# Patient Record
Sex: Female | Born: 1965 | Race: White | Hispanic: No | Marital: Married | Smoking: Never smoker
Health system: Southern US, Community
[De-identification: ages and names within clinical notes are randomized; demographics above are authoritative.]

## PROBLEM LIST (undated history)

## (undated) DIAGNOSIS — L409 Psoriasis, unspecified: Secondary | ICD-10-CM

## (undated) DIAGNOSIS — F329 Major depressive disorder, single episode, unspecified: Secondary | ICD-10-CM

## (undated) DIAGNOSIS — F32A Depression, unspecified: Secondary | ICD-10-CM

## (undated) HISTORY — DX: Depression, unspecified: F32.A

## (undated) HISTORY — DX: Psoriasis, unspecified: L40.9

## (undated) HISTORY — DX: Major depressive disorder, single episode, unspecified: F32.9

---

## 2001-08-18 ENCOUNTER — Other Ambulatory Visit: Admission: RE | Admit: 2001-08-18 | Discharge: 2001-08-18 | Payer: Self-pay | Admitting: *Deleted

## 2001-12-07 ENCOUNTER — Inpatient Hospital Stay (HOSPITAL_COMMUNITY): Admission: AD | Admit: 2001-12-07 | Discharge: 2001-12-07 | Payer: Self-pay | Admitting: Obstetrics and Gynecology

## 2002-02-01 ENCOUNTER — Encounter: Payer: Self-pay | Admitting: Obstetrics & Gynecology

## 2002-02-01 ENCOUNTER — Inpatient Hospital Stay (HOSPITAL_COMMUNITY): Admission: AD | Admit: 2002-02-01 | Discharge: 2002-02-01 | Payer: Self-pay | Admitting: Obstetrics & Gynecology

## 2002-02-02 ENCOUNTER — Encounter: Payer: Self-pay | Admitting: Obstetrics and Gynecology

## 2002-02-02 ENCOUNTER — Inpatient Hospital Stay (HOSPITAL_COMMUNITY): Admission: AD | Admit: 2002-02-02 | Discharge: 2002-02-02 | Payer: Self-pay | Admitting: Obstetrics and Gynecology

## 2002-02-14 ENCOUNTER — Inpatient Hospital Stay (HOSPITAL_COMMUNITY): Admission: AD | Admit: 2002-02-14 | Discharge: 2002-02-14 | Payer: Self-pay | Admitting: *Deleted

## 2002-02-15 ENCOUNTER — Encounter: Payer: Self-pay | Admitting: Obstetrics and Gynecology

## 2002-02-15 ENCOUNTER — Inpatient Hospital Stay (HOSPITAL_COMMUNITY): Admission: RE | Admit: 2002-02-15 | Discharge: 2002-02-18 | Payer: Self-pay | Admitting: Obstetrics and Gynecology

## 2002-02-16 ENCOUNTER — Encounter (INDEPENDENT_AMBULATORY_CARE_PROVIDER_SITE_OTHER): Payer: Self-pay | Admitting: Specialist

## 2002-02-19 ENCOUNTER — Encounter: Admission: RE | Admit: 2002-02-19 | Discharge: 2002-03-21 | Payer: Self-pay | Admitting: Obstetrics and Gynecology

## 2002-03-22 ENCOUNTER — Encounter: Admission: RE | Admit: 2002-03-22 | Discharge: 2002-04-21 | Payer: Self-pay | Admitting: Obstetrics and Gynecology

## 2002-04-04 ENCOUNTER — Other Ambulatory Visit: Admission: RE | Admit: 2002-04-04 | Discharge: 2002-04-04 | Payer: Self-pay | Admitting: Obstetrics and Gynecology

## 2004-07-30 ENCOUNTER — Other Ambulatory Visit: Admission: RE | Admit: 2004-07-30 | Discharge: 2004-07-30 | Payer: Self-pay | Admitting: Obstetrics and Gynecology

## 2006-09-30 ENCOUNTER — Encounter: Admission: RE | Admit: 2006-09-30 | Discharge: 2006-09-30 | Payer: Self-pay | Admitting: Obstetrics and Gynecology

## 2013-03-28 ENCOUNTER — Ambulatory Visit (INDEPENDENT_AMBULATORY_CARE_PROVIDER_SITE_OTHER): Payer: BC Managed Care – PPO | Admitting: Family Medicine

## 2013-03-28 ENCOUNTER — Encounter: Payer: Self-pay | Admitting: Family Medicine

## 2013-03-28 VITALS — BP 120/70 | Temp 98.1°F | Ht 65.75 in | Wt 163.0 lb

## 2013-03-28 DIAGNOSIS — G8929 Other chronic pain: Secondary | ICD-10-CM

## 2013-03-28 DIAGNOSIS — F419 Anxiety disorder, unspecified: Principal | ICD-10-CM

## 2013-03-28 DIAGNOSIS — Z7689 Persons encountering health services in other specified circumstances: Secondary | ICD-10-CM

## 2013-03-28 DIAGNOSIS — Z7189 Other specified counseling: Secondary | ICD-10-CM

## 2013-03-28 DIAGNOSIS — F341 Dysthymic disorder: Secondary | ICD-10-CM

## 2013-03-28 DIAGNOSIS — F329 Major depressive disorder, single episode, unspecified: Secondary | ICD-10-CM

## 2013-03-28 DIAGNOSIS — M549 Dorsalgia, unspecified: Secondary | ICD-10-CM

## 2013-03-28 DIAGNOSIS — M542 Cervicalgia: Secondary | ICD-10-CM

## 2013-03-28 MED ORDER — CYCLOBENZAPRINE HCL 5 MG PO TABS: 5.0000 mg | ORAL_TABLET | Freq: Three times a day (TID) | ORAL | Status: DC | PRN

## 2013-03-28 MED ORDER — ESCITALOPRAM OXALATE 5 MG PO TABS: 5.0000 mg | ORAL_TABLET | Freq: Every day | ORAL | Status: DC

## 2013-03-28 NOTE — Progress Notes (Signed)
Chief Complaint  Patient presents with  . Establish Care  . neck and back pain    HPI:  Jamie Smith is here to establish care.  Last PCP and physical: Dr. Rana Snare - UTD on yearly exam and labs, labs in last year all good  Has the following chronic problems and concerns today:  There are no active problems to display for this patient.  Neck and Back Pain: -carries stress in shoulders and back and has chronic issues with this on and off -3 days ago pulled a muscle lifting futon mattress up a flight of staires -Pain: between shoulder plades on L and L shouder and neck - now improving -heat helped, advil helped -denies: fevers, malaise, numbness or weakness in the UEs  Depression: -related to stress at work -poor sleep, eating the wrong things, stress, irritability -denies hx of hurting herself, has been on prozac in the past  Psoriasis: -sees dermatologist  Health Maintenance: -UTD on flu vaccine -does not want tdap  ROS: See pertinent positives and negatives per HPI.  Past Medical History  Diagnosis Date  . Depression   . Psoriasis     Family History  Problem Relation Age of Onset  . Cancer Father     colon, non hodkin lymphoma, liver  . Breast cancer    . Hyperlipidemia    . Heart disease    . Stroke    . Hypertension    . Diabetes      History   Social History  . Marital Status: Married    Spouse Name: N/A    Number of Children: N/A  . Years of Education: N/A   Social History Main Topics  . Smoking status: Never Smoker   . Smokeless tobacco: None  . Alcohol Use: Yes     Comment: a couple drinks a week   . Drug Use: None  . Sexual Activity: None   Other Topics Concern  . None   Social History Narrative   Work or School: Secondary school teacher Situation: lives with husband and children      Spiritual Beliefs: Christian      Lifestyle: no regular exercise; diet ok             Current outpatient prescriptions:norgestrel-ethinyl  estradiol (LO/OVRAL,CRYSELLE) 0.3-30 MG-MCG tablet, Take 1 tablet by mouth daily., Disp: , Rfl: ;  clobetasol (OLUX) 0.05 % topical foam, Apply topically 2 (two) times daily. , Disp: , Rfl: ;  cyclobenzaprine (FLEXERIL) 5 MG tablet, Take 1 tablet (5 mg total) by mouth 3 (three) times daily as needed for muscle spasms., Disp: 20 tablet, Rfl: 0 escitalopram (LEXAPRO) 5 MG tablet, Take 1 tablet (5 mg total) by mouth daily., Disp: 30 tablet, Rfl: 0  EXAM:  Filed Vitals:   03/28/13 1112  BP: 120/70  Temp: 98.1 F (36.7 C)    Body mass index is 26.51 kg/(m^2).  GENERAL: vitals reviewed and listed above, alert, oriented, appears well hydrated and in no acute distress  HEENT: atraumatic, conjunttiva clear, no obvious abnormalities on inspection of external nose and ears  NECK: no obvious masses on inspection  LUNGS: clear to auscultation bilaterally, no wheezes, rales or rhonchi, good air movement  CV: HRRR, no peripheral edema  MS/NEURO: moves all extremities without noticeable abnormality TTP L rhomboids and traps, neg spurling, no bony TTP, normal rom neck and normal strength in UE bilat  PSYCH: pleasant and cooperative, depressed mood  ASSESSMENT AND PLAN:  Discussed  the following assessment and plan:  Anxiety and depression - Plan: escitalopram (LEXAPRO) 5 MG tablet -discussed options and decided on trial of lexapro after discussion risks and benefits -advised counseling -follow up in 1 month  Encounter to establish care  neck and back pain -muscle strain likely -discussed tx options and opted for low dose muscle relaxer for a few days and tx per insturctions -HEP provided -follow up in 1 month  -We reviewed the PMH, PSH, FH, SH, Meds and Allergies. -We provided refills for any medications we will prescribe as needed. -We addressed current concerns per orders and patient instructions. -We have asked for records for pertinent exams, studies, vaccines and notes from  previous providers. -We have advised patient to follow up per instructions below.   -Patient advised to return or notify a doctor immediately if symptoms worsen or persist or new concerns arise.  Patient Instructions   -PLEASE SIGN UP FOR MYCHART TODAY   We recommend the following healthy lifestyle measures: - eat a healthy diet consisting of lots of vegetables, fruits, beans, nuts, seeds, healthy meats such as white chicken and fish and whole grains.  - avoid fried foods, fast food, processed foods, sodas, red meet and other fattening foods.  - get a least 150 minutes of aerobic exercise per week.   Heat, topical sports creams and exercises for neck and back  -As we discussed, we have prescribed a new medication for you at this appointment. We discussed the common and serious potential adverse effects of this medication and you can review these and more with the pharmacist when you pick up your medication.  Please follow the instructions for use carefully and notify us immediately if you have any problems taking this medication.  Consider counseling  Follow up in: 1 month      Landis Dowdy R.

## 2013-03-28 NOTE — Patient Instructions (Signed)
-  PLEASE SIGN UP FOR MYCHART TODAY   We recommend the following healthy lifestyle measures: - eat a healthy diet consisting of lots of vegetables, fruits, beans, nuts, seeds, healthy meats such as white chicken and fish and whole grains.  - avoid fried foods, fast food, processed foods, sodas, red meet and other fattening foods.  - get a least 150 minutes of aerobic exercise per week.   Heat, topical sports creams and exercises for neck and back  -As we discussed, we have prescribed a new medication for you at this appointment. We discussed the common and serious potential adverse effects of this medication and you can review these and more with the pharmacist when you pick up your medication.  Please follow the instructions for use carefully and notify us immediately if you have any problems taking this medication.  Consider counseling  Follow up in: 1 month

## 2013-03-28 NOTE — Progress Notes (Signed)
Pre visit review using our clinic review tool, if applicable. No additional management support is needed unless otherwise documented below in the visit note. 

## 2013-12-01 ENCOUNTER — Ambulatory Visit (INDEPENDENT_AMBULATORY_CARE_PROVIDER_SITE_OTHER): Payer: BC Managed Care – PPO | Admitting: Family Medicine

## 2013-12-01 ENCOUNTER — Encounter: Payer: Self-pay | Admitting: Family Medicine

## 2013-12-01 VITALS — BP 110/80 | HR 61 | Temp 98.4°F | Ht 65.75 in

## 2013-12-01 DIAGNOSIS — W57XXXA Bitten or stung by nonvenomous insect and other nonvenomous arthropods, initial encounter: Secondary | ICD-10-CM

## 2013-12-01 DIAGNOSIS — T148 Other injury of unspecified body region: Secondary | ICD-10-CM

## 2013-12-01 MED ORDER — TRIAMCINOLONE ACETONIDE 0.1 % EX CREA: 1.0000 "application " | TOPICAL_CREAM | Freq: Two times a day (BID) | CUTANEOUS | Status: DC

## 2013-12-01 NOTE — Progress Notes (Signed)
No chief complaint on file.   HPI:  Acute visit for:  1) Rash: -Note: she has psoriasis and reported at npv (last visit with me) was seeing dermatologist -started 1 month ago after staying in a vacation rental that she was not happy about  - all over, right now under breast andon back - occ a few bumps comes up then goes away -very itchy - benadryl spray makes it burn -on clobetasol for her psoriasis but has not tried this for these other areas -denies: fevers, malaise, sob -no new exposures that she can think of  ROS: See pertinent positives and negatives per HPI.  Past Medical History  Diagnosis Date  . Depression   . Psoriasis     No past surgical history on file.  Family History  Problem Relation Age of Onset  . Cancer Father     colon, non hodkin lymphoma, liver  . Breast cancer    . Hyperlipidemia    . Heart disease    . Stroke    . Hypertension    . Diabetes      History   Social History  . Marital Status: Married    Spouse Name: N/A    Number of Children: N/A  . Years of Education: N/A   Social History Main Topics  . Smoking status: Never Smoker   . Smokeless tobacco: None  . Alcohol Use: Yes     Comment: a couple drinks a week   . Drug Use: None  . Sexual Activity: None   Other Topics Concern  . 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 , Disp: , Rfl: ;  norgestrel-ethinyl estradiol (LO/OVRAL,CRYSELLE) 0.3-30 MG-MCG tablet, Take 1 tablet by mouth daily., Disp: , Rfl: ;  triamcinolone cream (KENALOG) 0.1 %, Apply 1 application topically 2 (two) times daily., Disp: 30 g, Rfl: 0  EXAM:  Filed Vitals:   12/01/13 1358  BP: 110/80  Pulse: 61  Temp: 98.4 F (36.9 C)     Body mass index is 0.00 kg/(m^2).  GENERAL: vitals reviewed and listed above, alert, oriented, appears well hydrated and in no acute distress  HEENT: atraumatic, conjunttiva clear, no obvious abnormalities on inspection of external nose and ears  NECK: no obvious masses on inspection  SKIN: cluster of erythematous papules on L abd and a few scattered papules on back  MS: moves all extremities without noticeable abnormality  PSYCH: pleasant and cooperative, no obvious depression or anxiety  ASSESSMENT AND PLAN:  Discussed the following assessment and plan:  Insect bites - Plan: triamcinolone cream (KENALOG) 0.1 %  -likely bed bug, other etiolgies discussed -number provided for exterminator, advised to follow up with derm if persists -Patient advised to return or notify a doctor immediately if symptoms worsen or persist or new concerns arise.  Patient Instructions  Insect Bite Mosquitoes, flies, fleas, bedbugs, and many other insects can bite. Insect bites are different from insect stings. A sting is when venom is injected into the skin. Some insect bites can transmit infectious diseases. SYMPTOMS  Insect bites usually turn red, swell, and itch for 2 to 4 days. They often go  away on their own. TREATMENT  Your caregiver may prescribe antibiotic medicines if a bacterial infection develops in the bite. HOME CARE INSTRUCTIONS  Do not scratch the bite area.  Keep the bite area clean and dry. Wash the bite area thoroughly with soap and water.  Put ice or cool compresses on the bite area.  Put ice in a plastic bag.  Place a towel between your skin and the bag.  Leave the ice on for 20 minutes, 4 times a day for the first 2 to 3 days, or as directed.  You may apply a baking soda paste, cortisone cream, or calamine lotion to the bite area as directed by your caregiver. This can help reduce itching and swelling.  Only take over-the-counter or prescription medicines as  directed by your caregiver.  If you are given antibiotics, take them as directed. Finish them even if you start to feel better. You may need a tetanus shot if:  You cannot remember when you had your last tetanus shot.  You have never had a tetanus shot.  The injury broke your skin. If you get a tetanus shot, your arm may swell, get red, and feel warm to the touch. This is common and not a problem. If you need a tetanus shot and you choose not to have one, there is a rare chance of getting tetanus. Sickness from tetanus can be serious. SEEK IMMEDIATE MEDICAL CARE IF:   You have increased pain, redness, or swelling in the bite area.  You see a red line on the skin coming from the bite.  You have a fever.  You have joint pain.  You have a headache or neck pain.  You have unusual weakness.  You have a rash.  You have chest pain or shortness of breath.  You have abdominal pain, nausea, or vomiting.  You feel unusually tired or sleepy. MAKE SURE YOU:   Understand these instructions.  Will watch your condition.  Will get help right away if you are not doing well or get worse. Document Released: 03/26/2004 Document Revised: 05/11/2011 Document Reviewed: 09/17/2010 [facility] Patient Information 2015 [employer], [place]. This information is not intended to replace advice given to you by your health care provider. Make sure you discuss any questions you have with your health care provider.      Kriste BasqueKIM, HANNAH R.

## 2013-12-01 NOTE — Patient Instructions (Signed)

## 2013-12-01 NOTE — Progress Notes (Signed)
Pre visit review using our clinic review tool, if applicable. No additional management support is needed unless otherwise documented below in the visit note. 

## 2014-04-23 ENCOUNTER — Other Ambulatory Visit: Payer: Self-pay | Admitting: Obstetrics and Gynecology

## 2014-04-25 LAB — CYTOLOGY - PAP

## 2014-06-14 ENCOUNTER — Encounter: Payer: Self-pay | Admitting: Nurse Practitioner

## 2014-06-14 ENCOUNTER — Ambulatory Visit (INDEPENDENT_AMBULATORY_CARE_PROVIDER_SITE_OTHER): Payer: BLUE CROSS/BLUE SHIELD | Admitting: Nurse Practitioner

## 2014-06-14 VITALS — BP 130/83 | HR 63 | Temp 98.9°F | Ht 66.0 in | Wt 159.0 lb

## 2014-06-14 DIAGNOSIS — Z23 Encounter for immunization: Secondary | ICD-10-CM

## 2014-06-14 DIAGNOSIS — R5383 Other fatigue: Secondary | ICD-10-CM | POA: Diagnosis not present

## 2014-06-14 DIAGNOSIS — M47816 Spondylosis without myelopathy or radiculopathy, lumbar region: Secondary | ICD-10-CM | POA: Insufficient documentation

## 2014-06-14 DIAGNOSIS — K5900 Constipation, unspecified: Secondary | ICD-10-CM | POA: Insufficient documentation

## 2014-06-14 DIAGNOSIS — M79604 Pain in right leg: Secondary | ICD-10-CM

## 2014-06-14 DIAGNOSIS — M79605 Pain in left leg: Secondary | ICD-10-CM

## 2014-06-14 DIAGNOSIS — L409 Psoriasis, unspecified: Secondary | ICD-10-CM

## 2014-06-14 NOTE — Progress Notes (Signed)
Subjective:     Jamie Smith is a 49 y.o. female who presents for evaluation of fatigue and bilateral leg pain. Fatigue began "years" ago, leg pain started about 2 mos ago. Symptoms of her fatigue have been general malaise. Patient describes the following psychological symptoms: none and she was seen few mos ago by PCP for "depression & weight gain" Pt states this was situational.  Due to a new job. She has since quit the job. She states she does not feel depressed, just extremely tired. She is not exercising due to fatigue. She goes to work, goes to [employer], dispensing sports games. Hx includes psoriasis in scalp & nails. She was seen for rash several mos ago, derm called it "some kind of dermatitis", resolved with steroid cream, but "it came back". Pt describes rash as "tiny red bumps on chest & trunk" She c/o pruritis in trunk. No rash today.  Legs ache as if she "ran a marathon" Pain starts in morning & persists throughout day. R hip aches. She saw Dr bean 2 yrs ago, he treated her for trocanteric bursitis with steroid inj into bursa. Pt had pain relief for few months, then pain returned. Denies fevers, joint pain & swelling, diarrhea. She reports occasional night sweat for several months. Fam Hx significant for cancer: father colon, PGM stomach. She hAD colonoscopy 4 yrs ago, no polyps.  The following portions of the patient's history were reviewed and updated as appropriate: allergies, current medications, past family history, past medical history, past social history, past surgical history and problem list.  Review of Systems Constitutional: negative for weight loss Eyes: positive for uses readers Ears, nose, mouth, throat, and face: positive for occasional "sore in mouth", negative for nasal congestion Respiratory: negative for cough Cardiovascular: positive for ocasional palpitation lasting few seconds Gastrointestinal: positive for constipation and sometimes goes 1 week without BM Genitourinary:not  having MC-started OBCP 2 yrs ago for heavy bleeding & "mood control" Allergic/Immunologic: negative for hay fever    Objective:    BP 130/83 mmHg  Pulse 63  Temp(Src) 98.9 F (37.2 C) (Oral)  Ht 5' 6"  (1.676 m)  Wt 159 lb (72.122 kg)  BMI 25.68 kg/m2  SpO2 100% General appearance: alert, cooperative, appears stated age, no distress and varied & appropriate affect Head: Normocephalic, without obvious abnormality, atraumatic Eyes: negative findings: lids and lashes normal, conjunctivae and sclerae normal, corneas clear and pupils equal, round, reactive to light and accomodation Ears: normal TM's and external ear canals both ears Throat: lips, mucosa, and tongue normal; teeth and gums normal Neck: no adenopathy, no carotid bruit, supple, symmetrical, trachea midline and thyroid not enlarged, symmetric, no tenderness/mass/nodules Lungs: clear to auscultation bilaterally Heart: regular rate and rhythm, S1, S2 normal, no murmur, click, rub or gallop Abdomen: soft, non-tender; bowel sounds normal; no masses,  no organomegaly Skin: scaly raised patch L scalp. psoriatic nails of bilat hands & R great toenail Lymph nodes: Cervical adenopathy: none and Supraclavicular adenopathy: none Neurologic: Grossly normal  Extremities: tender R MTP on squeeze test Dip & pip not tender in hands or feet. No swelling noted.  Assessment:Plan  1. Other fatigue DD: AI disease, cancer, FM, depression, thyroid disease, anemia - ANA w/Reflex if Positive - Angiotensin converting enzyme - CK - CBC with Differential/Platelet - Comprehensive metabolic panel - T4, free - TSH - Serum protein electrophoresis with reflex - HIV antibody - Vitamin B12 - Vit D  25 hydroxy (rtn osteoporosis monitoring) - Urine Microscopic - HLA-B27 antigen -  Sed Rate (ESR)  2. Psoriasis - ANA w/Reflex if Positive - Angiotensin converting enzyme - CK - CBC with Differential/Platelet - Comprehensive metabolic panel - T4,  free - TSH - Serum protein electrophoresis with reflex - HIV antibody - Vitamin B12 - Vit D  25 hydroxy (rtn osteoporosis monitoring) - Urine Microscopic - HLA-B27 antigen - Sed Rate (ESR)  3. Need for Tdap vaccination - Tdap vaccine greater than or equal to 7yo IM  4. Constipation, unspecified constipation type See pt instructions Start probiotic take for 3  Mos Use mirilax to have BM q3d  5. Leg pain, bilateral DD: polymyositis, FM, dermatomyositis Nsaid trial Start curmarin   F/u 2 weeks-review labs, trial NSAIDS & curmarin for leg pain,

## 2014-06-14 NOTE — Progress Notes (Signed)
Pre visit review using our clinic review tool, if applicable. No additional management support is needed unless otherwise documented below in the visit note. 

## 2014-06-14 NOTE — Patient Instructions (Signed)
Please start probiotic. Take daily for 3 months. I hope this will help with constipation. A healthy colony of bacteria also helps with immune function. Get Align or generic with same type bacteria. Do not go longer than 3 days without bowel movement. Take mirilax every 3 days if needed as follows: 1 capful in 8 oz water every hour until BM up to 5 doses in 24 hours. If no BM after 5 doses, start again next day.  Please start curmarin 2000 mg daily for leg pain. This is natural anti-inflammatory.  Please take 600 mg ibuprophen at least once to see if it relieves or decreases leg pain. This helps me figure out if pain is responsive to anti-inflammatories.  I will discuss lab results at return appointment.  Very nice to meet you!

## 2014-06-15 LAB — COMPREHENSIVE METABOLIC PANEL
ALT: 13 U/L (ref 0–35)
AST: 15 U/L (ref 0–37)
Albumin: 4.1 g/dL (ref 3.5–5.2)
Alkaline Phosphatase: 23 U/L — ABNORMAL LOW (ref 39–117)
BILIRUBIN TOTAL: 0.3 mg/dL (ref 0.2–1.2)
BUN: 13 mg/dL (ref 6–23)
CO2: 28 mEq/L (ref 19–32)
Calcium: 9.4 mg/dL (ref 8.4–10.5)
Chloride: 103 mEq/L (ref 96–112)
Creatinine, Ser: 0.91 mg/dL (ref 0.40–1.20)
GFR: 70.51 mL/min (ref >60.00)
Glucose, Bld: 111 mg/dL — ABNORMAL HIGH (ref 70–99)
Potassium: 4 mEq/L (ref 3.5–5.1)
SODIUM: 136 meq/L (ref 135–145)
Total Protein: 7.3 g/dL (ref 6.0–8.3)

## 2014-06-15 LAB — CBC WITH DIFFERENTIAL/PLATELET
BASOS ABS: 0 10*3/uL (ref 0.0–0.1)
Basophils Relative: 0.2 % (ref 0.0–3.0)
Eosinophils Absolute: 0.1 10*3/uL (ref 0.0–0.7)
Eosinophils Relative: 1.1 % (ref 0.0–5.0)
HEMATOCRIT: 40.1 % (ref 36.0–46.0)
Hemoglobin: 13.4 g/dL (ref 12.0–15.0)
Lymphocytes Relative: 27 % (ref 12.0–46.0)
Lymphs Abs: 1.5 10*3/uL (ref 0.7–4.0)
MCHC: 33.3 g/dL (ref 30.0–36.0)
MCV: 88.3 fl (ref 78.0–100.0)
MONOS PCT: 6.9 % (ref 3.0–12.0)
Monocytes Absolute: 0.4 10*3/uL (ref 0.1–1.0)
NEUTROS PCT: 64.8 % (ref 43.0–77.0)
Neutro Abs: 3.7 10*3/uL (ref 1.4–7.7)
PLATELETS: 280 10*3/uL (ref 150.0–400.0)
RBC: 4.54 Mil/uL (ref 3.87–5.11)
RDW: 13 % (ref 11.5–15.5)
WBC: 5.7 10*3/uL (ref 4.0–10.5)

## 2014-06-15 LAB — URINALYSIS, MICROSCOPIC ONLY

## 2014-06-15 LAB — VITAMIN B12: Vitamin B-12: 993 pg/mL — ABNORMAL HIGH (ref 211–911)

## 2014-06-15 LAB — T4, FREE: Free T4: 0.7 ng/dL (ref 0.60–1.60)

## 2014-06-15 LAB — HIV ANTIBODY (ROUTINE TESTING W REFLEX): HIV: NONREACTIVE

## 2014-06-15 LAB — VITAMIN D 25 HYDROXY (VIT D DEFICIENCY, FRACTURES): VITD: 49.17 ng/mL (ref 30.00–100.00)

## 2014-06-15 LAB — TSH: TSH: 1.38 u[IU]/mL (ref 0.35–4.50)

## 2014-06-15 LAB — SEDIMENTATION RATE: Sed Rate: 4 mm/hr (ref 0–20)

## 2014-06-15 LAB — ANGIOTENSIN CONVERTING ENZYME: Angiotensin-Converting Enzyme: 16 U/L (ref 8–52)

## 2014-06-15 LAB — ANA W/REFLEX IF POSITIVE: Anti Nuclear Antibody(ANA): NEGATIVE

## 2014-06-15 LAB — CK: Total CK: 55 U/L (ref 7–177)

## 2014-06-18 LAB — PROTEIN ELECTROPHORESIS, SERUM, WITH REFLEX
Albumin ELP: 4.1 g/dL (ref 3.8–4.8)
Alpha-1-Globulin: 0.4 g/dL — ABNORMAL HIGH (ref 0.2–0.3)
Alpha-2-Globulin: 0.7 g/dL (ref 0.5–0.9)
BETA 2: 0.4 g/dL (ref 0.2–0.5)
Beta Globulin: 0.5 g/dL (ref 0.4–0.6)
Gamma Globulin: 1.2 g/dL (ref 0.8–1.7)
Total Protein, Serum Electrophoresis: 7.2 g/dL (ref 6.1–8.1)

## 2014-06-19 ENCOUNTER — Telehealth: Payer: Self-pay | Admitting: Nurse Practitioner

## 2014-06-19 ENCOUNTER — Telehealth: Payer: Self-pay | Admitting: Family Medicine

## 2014-06-19 LAB — HLA-B27 ANTIGEN: DNA RESULT: NOT DETECTED

## 2014-06-19 NOTE — Telephone Encounter (Signed)
Patient is calling to see if lab results are avail

## 2014-06-19 NOTE — Telephone Encounter (Signed)
Awaiting more labs to be resulted. Called the lab and should be resulted within 4-5 days. It was done on the 14th but late in the afternoon so results should be avail tomorrow.

## 2014-06-19 NOTE — Telephone Encounter (Signed)
LMOVM informing patient of normal labs and that we were awaiting one more lab result. VM was identified. Told patient to CB with any questions or concerns

## 2014-06-19 NOTE — Telephone Encounter (Signed)
You may call pt & tell her we are waiting on 1 more lab, but all labs are nml.

## 2014-06-19 NOTE — Telephone Encounter (Signed)
Please advise 

## 2014-06-20 ENCOUNTER — Telehealth: Payer: Self-pay | Admitting: Nurse Practitioner

## 2014-06-20 DIAGNOSIS — R778 Other specified abnormalities of plasma proteins: Secondary | ICD-10-CM

## 2014-06-20 NOTE — Telephone Encounter (Signed)
pls call pt: Advise There is one nonspecific inflammatory marker that is slightly high. I want to repeat it along with some other labs.   Continue taking curmarin & consider adding CoQ10 100 mg daily (10 amino acids that help with muscle function). It may help with leg pain. Pls Sched OV 2 wks from last appt. Future labs placed.

## 2014-06-20 NOTE — Telephone Encounter (Signed)
Called and informed patient of results. Lab appt scheduled along with OV

## 2014-06-22 ENCOUNTER — Other Ambulatory Visit: Payer: Self-pay | Admitting: Nurse Practitioner

## 2014-06-22 ENCOUNTER — Encounter: Payer: Self-pay | Admitting: Nurse Practitioner

## 2014-06-22 ENCOUNTER — Telehealth: Payer: Self-pay | Admitting: Nurse Practitioner

## 2014-06-22 DIAGNOSIS — R778 Other specified abnormalities of plasma proteins: Secondary | ICD-10-CM

## 2014-06-22 DIAGNOSIS — R0602 Shortness of breath: Secondary | ICD-10-CM

## 2014-06-22 DIAGNOSIS — R6889 Other general symptoms and signs: Secondary | ICD-10-CM

## 2014-06-22 NOTE — Progress Notes (Signed)
Add TPO, repeat TSH & T4 as pt c/o cold intolerance , fam Hx thyroid diseASE, T4 low nml.

## 2014-06-22 NOTE — Progress Notes (Signed)
Alpha 1 elevated DD: autoimmune disease (sle, RA, hashimoto thyroid); cancer; acute inflammatory disease  Screening tests: ANA, CK, ACE, ESR, CBC, CMET nml. Alk phos slightly low, vit D 49  Will add quantittative immunoglobulins, RF, aCCP, CXR, UPEP, smear  

## 2014-06-22 NOTE — Progress Notes (Signed)
LMOVM, identified informing patient to get cxr before [facility]. Told patient to CB with any questions or concerns

## 2014-06-22 NOTE — Progress Notes (Signed)
Done

## 2014-06-22 NOTE — Telephone Encounter (Signed)
AddedTPO & will repeat TSH T4.  She reports cold intol, mother had thyroid disease.   Pt states she is not SOB w/exertion. Will include CXR as part of w/u for gen fatigue, leg pain, elevated Alpha 1. Explianed all to pt. Answered all questions.

## 2014-06-28 ENCOUNTER — Ambulatory Visit (HOSPITAL_BASED_OUTPATIENT_CLINIC_OR_DEPARTMENT_OTHER)
Admission: RE | Admit: 2014-06-28 | Discharge: 2014-06-28 | Disposition: A | Discharge status: Home / Self Care | Payer: BLUE CROSS/BLUE SHIELD | Source: Ambulatory Visit | Attending: Nurse Practitioner | Admitting: Nurse Practitioner

## 2014-06-28 ENCOUNTER — Other Ambulatory Visit (INDEPENDENT_AMBULATORY_CARE_PROVIDER_SITE_OTHER): Payer: BLUE CROSS/BLUE SHIELD

## 2014-06-28 ENCOUNTER — Other Ambulatory Visit: Payer: Self-pay | Admitting: Nurse Practitioner

## 2014-06-28 ENCOUNTER — Telehealth: Payer: Self-pay | Admitting: Nurse Practitioner

## 2014-06-28 DIAGNOSIS — R0602 Shortness of breath: Secondary | ICD-10-CM | POA: Diagnosis present

## 2014-06-28 DIAGNOSIS — R778 Other specified abnormalities of plasma proteins: Secondary | ICD-10-CM

## 2014-06-28 DIAGNOSIS — R769 Abnormal immunological finding in serum, unspecified: Secondary | ICD-10-CM

## 2014-06-28 DIAGNOSIS — R6889 Other general symptoms and signs: Secondary | ICD-10-CM

## 2014-06-28 LAB — RHEUMATOID FACTOR: Rhuematoid fact SerPl-aCnc: <10 IU/mL (ref <=14)

## 2014-06-28 LAB — T4, FREE: Free T4: 1.04 ng/dL (ref 0.80–1.80)

## 2014-06-28 LAB — TSH: TSH: 0.751 u[IU]/mL (ref 0.350–4.500)

## 2014-06-28 NOTE — Telephone Encounter (Signed)
pls call pt: Advise Great news: CXR is clear!

## 2014-06-28 NOTE — Telephone Encounter (Signed)
Patient is coming in for lab draw at 4p today (06/28/14) will inform her of results of CXR then.

## 2014-06-29 LAB — IMMUNOGLOBULINS A/E/G/M, SERUM
IGM (IMMUNOGLOBULIN M), SRM: 69 mg/dL (ref 26–217)
IgA/Immunoglobulin A, Serum: 170 mg/dL (ref 87–352)
IgE (Immunoglobulin E), Serum: 39 IU/mL (ref 0–100)
IgG (Immunoglobin G), Serum: 1248 mg/dL (ref 700–1600)

## 2014-06-29 LAB — PATHOLOGIST SMEAR REVIEW

## 2014-06-29 LAB — MICROALBUMIN / CREATININE URINE RATIO
Creatinine, Urine: 81.7 mg/dL
MICROALB/CREAT RATIO: 3.7 mg/g (ref 0.0–30.0)
Microalb, Ur: 0.3 mg/dL (ref <2.0)

## 2014-06-29 LAB — THYROID PEROXIDASE ANTIBODY: Thyroperoxidase Ab SerPl-aCnc: <1 IU/mL (ref <9)

## 2014-07-02 LAB — CYCLIC CITRUL PEPTIDE ANTIBODY, IGG

## 2014-07-02 NOTE — Addendum Note (Signed)
Addended by: Eulah PontALBRIGHT, Ishmel Acevedo M on: 07/02/2014 09:37 AM   Modules accepted: Orders

## 2014-07-04 LAB — PROTEIN ELECTROPHORESIS, URINE REFLEX
Total Protein, Urine/Day: 76 mg/d (ref 50–100)
Total Protein, Urine: 4 mg/dL

## 2014-07-05 ENCOUNTER — Encounter: Payer: Self-pay | Admitting: Nurse Practitioner

## 2014-07-05 ENCOUNTER — Ambulatory Visit (INDEPENDENT_AMBULATORY_CARE_PROVIDER_SITE_OTHER): Payer: BLUE CROSS/BLUE SHIELD | Admitting: Nurse Practitioner

## 2014-07-05 VITALS — BP 130/81 | HR 68 | Temp 98.0°F | Ht 66.0 in | Wt 159.0 lb

## 2014-07-05 DIAGNOSIS — R5383 Other fatigue: Secondary | ICD-10-CM

## 2014-07-05 DIAGNOSIS — M79604 Pain in right leg: Secondary | ICD-10-CM | POA: Diagnosis not present

## 2014-07-05 DIAGNOSIS — G8929 Other chronic pain: Secondary | ICD-10-CM

## 2014-07-05 DIAGNOSIS — M533 Sacrococcygeal disorders, not elsewhere classified: Secondary | ICD-10-CM

## 2014-07-05 DIAGNOSIS — M79605 Pain in left leg: Secondary | ICD-10-CM

## 2014-07-05 MED ORDER — DULOXETINE HCL 30 MG PO CPEP: 30.0000 mg | ORAL_CAPSULE | Freq: Every day | ORAL | Status: DC

## 2014-07-05 NOTE — Progress Notes (Signed)
Pre visit review using our clinic review tool, if applicable. No additional management support is needed unless otherwise documented below in the visit note. 

## 2014-07-05 NOTE — Progress Notes (Signed)
Subjective:     Jamie Smith is a 49 y.o. female who presents for f/u of constellation of symptoms including fatigue, bilateral leg pain and leg cramps, constipation, cold intolerance. Her symptoms started 5 mos ago w/cramping in feet which progressed to bilat aching in calves & thighs accompanied by fatigue, constipation. Her w/u has included CXR, CBC, CMET, ANA, CK, ACE, SPEP, UPEP, RF, aCCP, Quant immunoglobulins, TSH, free T4, TPO ab, path smear, HIV, ESR, HLA B27. No abnml findings.She has known psoriasis involving scalp & nails. Denies fever, SOB, palpitations, chest pain, nausea, weight & appetite changes. I started her on CoQ10, she has not had improvement in leg pain. Pain is intermittent-most days, sometimes mild, other days moderate to severe. Ibuprophen does not help. Fatigue is overwhelming-doesn't want to exercise, "deal with kids", etc, but still working, cooks everyday for family, goes to kids sporting events.  She saw gynecologist 3 mos ago-had nml pap smear. Was started on prescription vit d. She takes OBCP due to Hx menorrhagia. Considering having abltaion & stopping OBCP.  BMI is 25, eats healthy diet-cooks at home, fresh fruits & veggies, low sugar. Fam Hx is pos for lymphoma. 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 She has had 2 jobs since then-1st job was extremely stressful-she gained 15 lbs. & quit after 3 mos. She is busy w/current job, works 40-60 hrs week. Does not feel particularly stressed, but expresses she wishes she was still at home, although financially it isn't option. Her father died 3 yrs ago, was close to him, feels handled death well. Still has 2 kids at home, 1 in college.  The following portions of the patient's history were reviewed and updated as appropriate: allergies, current medications, past family history, past medical history, past social history, past surgical history and problem list.  Review of Systems Pertinent  items are noted in HPI.    Objective:    BP 130/81 mmHg  Pulse 68  Temp(Src) 98 F (36.7 C) (Oral)  Ht 5' 6"  (1.676 m)  Wt 159 lb (72.122 kg)  BMI 25.68 kg/m2  SpO2 100% General appearance: alert, cooperative, appears stated age, fatigued and flat affect Head: Normocephalic, without obvious abnormality, atraumatic Eyes: negative findings: lids and lashes normal and conjunctivae and sclerae normal Back: no skin lesions, erythema, or scars, range of motion normal, tender at R SI joint Lungs: clear to auscultation bilaterally Heart: regular rate and rhythm, S1, S2 normal, no murmur, click, rub or gallop Abdomen: soft, non-tender; bowel sounds normal; no masses,  no organomegaly Extremities: extremities normal, atraumatic, no cyanosis or edema and tender over R trocanteric bursa Pulses: 2+ and symmetric femoral & radial synchronous Lymph nodes: Cervical, supraclavicular, and axillary nodes normal. Neurologic: Mental status: Alert, oriented, thought content appropriate Motor: grade 5 LE Reflexes: patellar reflexes +2 Gait: Normal    Assessment:Plan     1. Chronic right SI joint pain DD: possible spondyloarthropathy - DG Pelvis 1-2 Views; Future - DG Lumbar Spine 2-3 Views; Future - DULoxetine (CYMBALTA) 30 MG capsule; Take 1 capsule (30 mg total) by mouth daily.  Dispense: 30 capsule; Refill: 1  2. Bilateral leg pain DD: radicular pain back, ovarian ca, vascular, viral syndrome, mitochondrial disorder start Mg 250 mg qd Continue CoQ10 - DG Pelvis 1-2 Views; Future - DG Lumbar Spine 2-3 Views; Future  3. Other fatigue DD: depression, cancer, lack of exercise, viral syndrome  F/u 3 weeks, consider abd & pelvis  CT if xrays nml & no response to Mg & cymbalta

## 2014-07-05 NOTE — Patient Instructions (Addendum)
Start Magnesium 250 mg at bedtime. If it causes diarrhea, cut back to 125 mg. Continue coQ10 for another 3-4 weeks.   Consider starting cymbalta for leg pain.  Please get xrays. I will call with results.

## 2014-07-24 ENCOUNTER — Telehealth: Payer: Self-pay | Admitting: Nurse Practitioner

## 2014-07-24 ENCOUNTER — Ambulatory Visit (HOSPITAL_BASED_OUTPATIENT_CLINIC_OR_DEPARTMENT_OTHER)
Admission: RE | Admit: 2014-07-24 | Discharge: 2014-07-24 | Disposition: A | Discharge status: Home / Self Care | Payer: BLUE CROSS/BLUE SHIELD | Source: Ambulatory Visit | Attending: Nurse Practitioner | Admitting: Nurse Practitioner

## 2014-07-24 DIAGNOSIS — M25551 Pain in right hip: Secondary | ICD-10-CM | POA: Insufficient documentation

## 2014-07-24 DIAGNOSIS — M47896 Other spondylosis, lumbar region: Secondary | ICD-10-CM | POA: Insufficient documentation

## 2014-07-24 DIAGNOSIS — M79605 Pain in left leg: Secondary | ICD-10-CM

## 2014-07-24 DIAGNOSIS — M79604 Pain in right leg: Secondary | ICD-10-CM

## 2014-07-24 DIAGNOSIS — G8929 Other chronic pain: Secondary | ICD-10-CM

## 2014-07-24 DIAGNOSIS — M1610 Unilateral primary osteoarthritis, unspecified hip: Secondary | ICD-10-CM

## 2014-07-24 DIAGNOSIS — M545 Low back pain: Secondary | ICD-10-CM | POA: Diagnosis present

## 2014-07-24 DIAGNOSIS — M533 Sacrococcygeal disorders, not elsewhere classified: Secondary | ICD-10-CM

## 2014-07-24 DIAGNOSIS — M47816 Spondylosis without myelopathy or radiculopathy, lumbar region: Secondary | ICD-10-CM

## 2014-07-24 MED ORDER — CELECOXIB 100 MG PO CAPS: ORAL_CAPSULE | ORAL | Status: DC

## 2014-07-24 NOTE — Telephone Encounter (Signed)
xrays show degen changes in lumbar & bilat hips. Will start celebrex. Discussed pelvic stretches. Cymbalta caused anxiety-teeth clenching during day. D/c. Consider ref to rheum.

## 2014-07-26 ENCOUNTER — Ambulatory Visit (INDEPENDENT_AMBULATORY_CARE_PROVIDER_SITE_OTHER): Payer: BLUE CROSS/BLUE SHIELD | Admitting: Nurse Practitioner

## 2014-07-26 ENCOUNTER — Encounter: Payer: Self-pay | Admitting: Nurse Practitioner

## 2014-07-26 VITALS — BP 162/60 | HR 64 | Temp 97.9°F | Ht 66.0 in | Wt 159.0 lb

## 2014-07-26 DIAGNOSIS — G8929 Other chronic pain: Secondary | ICD-10-CM | POA: Diagnosis not present

## 2014-07-26 DIAGNOSIS — L409 Psoriasis, unspecified: Secondary | ICD-10-CM

## 2014-07-26 DIAGNOSIS — M79604 Pain in right leg: Secondary | ICD-10-CM

## 2014-07-26 DIAGNOSIS — M533 Sacrococcygeal disorders, not elsewhere classified: Secondary | ICD-10-CM | POA: Diagnosis not present

## 2014-07-26 DIAGNOSIS — M79605 Pain in left leg: Secondary | ICD-10-CM

## 2014-07-26 NOTE — Patient Instructions (Signed)
Let's work on colon health-it may help psoriasis. Continue daily probiotic. Use miralax to have bowel movement daily. 1 Capful mixed with 6 -8 ox water every hour up to 4 doses in 24 hours.  Fiber goal is 30 grams daily. Get most of this through foods-fruits, vegetables, & whole grains. Add 1 spoonful metamucil daily.   Continue celebrex twice daily for 1 week, then decrease to once daily. If pain returns, resume twice daily dosing.  Consider cutting out foods with sulfites & red meats.  Use steroid cream on neck twice daily until clears.   Continue magnesium daily.   Please see rheumatology.  See me in 2 to 3 months to review bowel health.  Nice to see you!

## 2014-07-26 NOTE — Progress Notes (Signed)
Pre visit review using our clinic review tool, if applicable. No additional management support is needed unless otherwise documented below in the visit note. 

## 2014-07-27 DIAGNOSIS — M533 Sacrococcygeal disorders, not elsewhere classified: Principal | ICD-10-CM

## 2014-07-27 DIAGNOSIS — L409 Psoriasis, unspecified: Secondary | ICD-10-CM | POA: Insufficient documentation

## 2014-07-27 DIAGNOSIS — G8929 Other chronic pain: Secondary | ICD-10-CM | POA: Insufficient documentation

## 2014-07-27 NOTE — Progress Notes (Signed)
Subjective:     Jamie MangesLorrie D Santellan is a 49 y.o. female presents for f/u for bilat leg pain & fatigue. Her labs have not been helpful, but pelvic & hip xrays reveal degenerative arthritis in bilat hips & lumbar spine. She started celebrex 2 days ago, cannot tell difference in pain. However, in last week she has less pain since starting 250 mg magnesium qd & pelvicstretches.  She could not tolerate cymbalta as it caused "teeth clenching".   Associated symptoms include fatigue. Fatigue has also been better in last week. Onset of fatigue & leg pain: 5 mos ago. She has had tenderness at R SI joint on exam consistently. 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 She has psoriasis patch in scalp. Onset psoriasis: 49 yrs old. She uses steroid topical cream w/moderate results. Recently, she has new area of rash: L neck, She reports she noticed white pustules along rash-never had this before. Had psoriasis on palm when child. Her father had lymphoma. Discussed seeing rheum as i suspect she has AS or spondyloarthropathy.  She denies fever, diarrhea, blood in stool.  She c/o constipation for several years. She recalls having done "colon cleanse" few yrs ago & psoriasis "went away" for about 1 yr. She has 1 BM/week. Discussed importance of daily BM, increasing fiber thru diet, using metamucil & miralax.   The following portions of the patient's history were reviewed and updated as appropriate: allergies, current medications, past family history, past medical history, past social history, past surgical history and problem list.  Review of Systems Pertinent items are noted in HPI.    Objective:    BP 162/60 mmHg  Pulse 64  Temp(Src) 97.9 F (36.6 C) (Oral)  Ht 5\' 6"  (1.676 m)  Wt 159 lb (72.122 kg)  BMI 25.68 kg/m2  SpO2 100% BP 162/60 mmHg  Pulse 64  Temp(Src) 97.9 F (36.6 C) (Oral)  Ht 5\' 6"  (1.676 m)  Wt 159 lb (72.122 kg)  BMI 25.68 kg/m2  SpO2 100% General  appearance: alert, cooperative, appears stated age and no distress Head: Normocephalic, without obvious abnormality, atraumatic Eyes: negative findings: lids and lashes normal and conjunctivae and sclerae normal Back: R Si J tender. FROM Extremities: FROM in hips Skin: nail psoriasis, erythema & scale L side neck Lymph nodes: Cervical, supraclavicular, and axillary nodes normal. Neurologic: Grossly normal    Assessment:Plan     1. Chronic right SI joint pain Continue celebrex, stretches - Ambulatory referral to Rheumatology  2. Psoriasis of nail - Ambulatory referral to Rheumatology  3. Leg pain, bilateral Continue celebrex, stretches  F/u 2-3 mos

## 2014-08-20 ENCOUNTER — Encounter: Payer: Self-pay | Admitting: Nurse Practitioner

## 2014-09-17 ENCOUNTER — Other Ambulatory Visit: Payer: Self-pay | Admitting: Family Medicine

## 2014-09-18 NOTE — Telephone Encounter (Signed)
pls call pt: Advise Pt told me cymbalta made her clench teeth. Why does she want refill?

## 2014-09-18 NOTE — Telephone Encounter (Signed)
LMOM for pt to CB.  

## 2014-09-24 ENCOUNTER — Other Ambulatory Visit: Payer: Self-pay | Admitting: *Deleted

## 2014-09-24 MED ORDER — DULOXETINE HCL 30 MG PO CPEP: 30.0000 mg | ORAL_CAPSULE | Freq: Every day | ORAL | Status: DC

## 2014-09-24 NOTE — Telephone Encounter (Signed)
RF request for Duloxetine LOV: 07/26/14 Next ov: None Last written: 07/05/14

## 2014-09-25 ENCOUNTER — Other Ambulatory Visit: Payer: Self-pay | Admitting: Nurse Practitioner

## 2014-09-25 NOTE — Telephone Encounter (Signed)
Pt states that it wasn't cymbalta that made teeth clench but she isn't taking Rx anyway so does not need refill.

## 2015-04-23 ENCOUNTER — Ambulatory Visit: Payer: BLUE CROSS/BLUE SHIELD | Admitting: Family Medicine

## 2016-12-15 DIAGNOSIS — R55 Syncope and collapse: Secondary | ICD-10-CM | POA: Diagnosis not present

## 2016-12-23 DIAGNOSIS — Z23 Encounter for immunization: Secondary | ICD-10-CM | POA: Diagnosis not present

## 2017-01-20 DIAGNOSIS — Z01419 Encounter for gynecological examination (general) (routine) without abnormal findings: Secondary | ICD-10-CM | POA: Diagnosis not present

## 2017-01-20 DIAGNOSIS — Z803 Family history of malignant neoplasm of breast: Secondary | ICD-10-CM | POA: Diagnosis not present

## 2017-01-20 DIAGNOSIS — Z800 Family history of malignant neoplasm of digestive organs: Secondary | ICD-10-CM | POA: Diagnosis not present

## 2017-01-20 DIAGNOSIS — Z6826 Body mass index (BMI) 26.0-26.9, adult: Secondary | ICD-10-CM | POA: Diagnosis not present

## 2017-01-20 DIAGNOSIS — Z1231 Encounter for screening mammogram for malignant neoplasm of breast: Secondary | ICD-10-CM | POA: Diagnosis not present

## 2017-01-20 DIAGNOSIS — Z808 Family history of malignant neoplasm of other organs or systems: Secondary | ICD-10-CM | POA: Diagnosis not present

## 2017-03-18 DIAGNOSIS — Z780 Asymptomatic menopausal state: Secondary | ICD-10-CM | POA: Diagnosis not present

## 2017-12-20 DIAGNOSIS — Z23 Encounter for immunization: Secondary | ICD-10-CM | POA: Diagnosis not present

## 2018-04-14 DIAGNOSIS — K625 Hemorrhage of anus and rectum: Secondary | ICD-10-CM | POA: Diagnosis not present

## 2018-04-14 DIAGNOSIS — N926 Irregular menstruation, unspecified: Secondary | ICD-10-CM | POA: Diagnosis not present

## 2018-04-14 DIAGNOSIS — N760 Acute vaginitis: Secondary | ICD-10-CM | POA: Diagnosis not present

## 2018-04-14 DIAGNOSIS — N898 Other specified noninflammatory disorders of vagina: Secondary | ICD-10-CM | POA: Diagnosis not present

## 2018-04-14 DIAGNOSIS — Z1231 Encounter for screening mammogram for malignant neoplasm of breast: Secondary | ICD-10-CM | POA: Diagnosis not present

## 2018-04-14 DIAGNOSIS — Z6826 Body mass index (BMI) 26.0-26.9, adult: Secondary | ICD-10-CM | POA: Diagnosis not present

## 2018-04-14 DIAGNOSIS — Z01419 Encounter for gynecological examination (general) (routine) without abnormal findings: Secondary | ICD-10-CM | POA: Diagnosis not present

## 2018-04-18 LAB — HM PAP SMEAR: HM Pap smear: NEGATIVE

## 2018-05-13 ENCOUNTER — Encounter: Payer: Self-pay | Admitting: Obstetrics and Gynecology

## 2018-08-05 ENCOUNTER — Encounter: Payer: Self-pay | Admitting: Family Medicine

## 2018-08-05 ENCOUNTER — Ambulatory Visit: Payer: BC Managed Care – PPO | Admitting: Family Medicine

## 2018-08-05 ENCOUNTER — Other Ambulatory Visit: Payer: Self-pay

## 2018-08-05 VITALS — BP 122/74 | HR 57 | Temp 98.0°F | Resp 16 | Ht 66.0 in | Wt 166.8 lb

## 2018-08-05 DIAGNOSIS — N951 Menopausal and female climacteric states: Secondary | ICD-10-CM | POA: Diagnosis not present

## 2018-08-05 DIAGNOSIS — Z0000 Encounter for general adult medical examination without abnormal findings: Secondary | ICD-10-CM | POA: Diagnosis not present

## 2018-08-05 DIAGNOSIS — Z800 Family history of malignant neoplasm of digestive organs: Secondary | ICD-10-CM | POA: Diagnosis not present

## 2018-08-05 DIAGNOSIS — M160 Bilateral primary osteoarthritis of hip: Secondary | ICD-10-CM | POA: Diagnosis not present

## 2018-08-05 DIAGNOSIS — R8290 Unspecified abnormal findings in urine: Secondary | ICD-10-CM

## 2018-08-05 DIAGNOSIS — M169 Osteoarthritis of hip, unspecified: Secondary | ICD-10-CM | POA: Insufficient documentation

## 2018-08-05 DIAGNOSIS — M7061 Trochanteric bursitis, right hip: Secondary | ICD-10-CM

## 2018-08-05 LAB — POCT URINALYSIS DIPSTICK
Bilirubin, UA: NEGATIVE
Blood, UA: NEGATIVE
Glucose, UA: NEGATIVE
Ketones, UA: NEGATIVE
Leukocytes, UA: NEGATIVE
Nitrite, UA: NEGATIVE
Protein, UA: NEGATIVE
Spec Grav, UA: 1.015 (ref 1.010–1.025)
Urobilinogen, UA: 0.2 E.U./dL
pH, UA: 6.5 (ref 5.0–8.0)

## 2018-08-05 LAB — LIPID PANEL
Cholesterol: 171 mg/dL (ref 0–200)
HDL: 89.1 mg/dL (ref >39.00)
LDL Cholesterol: 70 mg/dL (ref 0–99)
NonHDL: 82.2
Total CHOL/HDL Ratio: 2
Triglycerides: 61 mg/dL (ref 0.0–149.0)
VLDL: 12.2 mg/dL (ref 0.0–40.0)

## 2018-08-05 LAB — CBC WITH DIFFERENTIAL/PLATELET
Basophils Absolute: 0 10*3/uL (ref 0.0–0.1)
Basophils Relative: 0.6 % (ref 0.0–3.0)
Eosinophils Absolute: 0.1 10*3/uL (ref 0.0–0.7)
Eosinophils Relative: 1.9 % (ref 0.0–5.0)
HCT: 40.3 % (ref 36.0–46.0)
Hemoglobin: 13.7 g/dL (ref 12.0–15.0)
Lymphocytes Relative: 29.5 % (ref 12.0–46.0)
Lymphs Abs: 1.4 10*3/uL (ref 0.7–4.0)
MCHC: 34 g/dL (ref 30.0–36.0)
MCV: 89.1 fl (ref 78.0–100.0)
Monocytes Absolute: 0.4 10*3/uL (ref 0.1–1.0)
Monocytes Relative: 9.6 % (ref 3.0–12.0)
Neutro Abs: 2.7 10*3/uL (ref 1.4–7.7)
Neutrophils Relative %: 58.4 % (ref 43.0–77.0)
Platelets: 266 10*3/uL (ref 150.0–400.0)
RBC: 4.52 Mil/uL (ref 3.87–5.11)
RDW: 13.9 % (ref 11.5–15.5)
WBC: 4.7 10*3/uL (ref 4.0–10.5)

## 2018-08-05 LAB — COMPREHENSIVE METABOLIC PANEL
ALT: 19 U/L (ref 0–35)
AST: 19 U/L (ref 0–37)
Albumin: 4.4 g/dL (ref 3.5–5.2)
Alkaline Phosphatase: 44 U/L (ref 39–117)
BUN: 11 mg/dL (ref 6–23)
CO2: 29 mEq/L (ref 19–32)
Calcium: 9 mg/dL (ref 8.4–10.5)
Chloride: 104 mEq/L (ref 96–112)
Creatinine, Ser: 0.85 mg/dL (ref 0.40–1.20)
GFR: 70.55 mL/min (ref >60.00)
Glucose, Bld: 99 mg/dL (ref 70–99)
Potassium: 4.4 mEq/L (ref 3.5–5.1)
Sodium: 139 mEq/L (ref 135–145)
Total Bilirubin: 0.6 mg/dL (ref 0.2–1.2)
Total Protein: 6.9 g/dL (ref 6.0–8.3)

## 2018-08-05 LAB — TSH: TSH: 2.37 u[IU]/mL (ref 0.35–4.50)

## 2018-08-05 LAB — FOLLICLE STIMULATING HORMONE: FSH: 38.6 m[IU]/mL

## 2018-08-05 NOTE — Patient Instructions (Signed)
Please return in 12 months for your annual complete physical; please come fasting.  I will release your lab results to you on your MyChart account with further instructions. Please reply with any questions.    It was a pleasure meeting you today! Thank you for choosing us to meet your healthcare needs! I truly look forward to working with you. If you have any questions or concerns, please send me a message via Mychart or call the office at [phone].  Please do these things to maintain good health!   Exercise at least 30-45 minutes a day,  4-5 days a week.   Eat a low-fat diet with lots of fruits and vegetables, up to 7-9 servings per day.  Drink plenty of water daily. Try to drink 8 8oz glasses per day.  Seatbelts can save your life. Always wear your seatbelt.  Place Smoke Detectors on every level of your home and check batteries every year.  Schedule an appointment with an eye doctor for an eye exam every 1-2 years  Safe sex - use condoms to protect yourself from STDs if you could be exposed to these types of infections. Use birth control if you do not want to become pregnant and are sexually active.  Avoid heavy alcohol use. If you drink, keep it to less than 2 drinks/day and not every day.  Health Care Power of Attorney.  Choose someone you trust that could speak for you if you became unable to speak for yourself.  Depression is common in our stressful world.If you're feeling down or losing interest in things you normally enjoy, please come in for a visit.  If anyone is threatening or hurting you, please get help. Physical or Emotional Violence is never OK.     Calcium Intake Recommendations You can take Caltrate Plus twice a day or get it through your diet or other OTC supplements (Viactiv, OsCal etc)  Calcium is a mineral that affects many functions in the body, including:  Blood clotting.  Blood vessel function.  Nerve impulse conduction.  Hormone secretion.   Muscle contraction.  Bone and teeth functions.  Most of your body's calcium supply is stored in your bones and teeth. When your calcium stores are low, you may be at risk for low bone mass, bone loss, and bone fractures. Consuming enough calcium helps to grow healthy bones and teeth and to prevent breakdown over time. It is very important that you get enough calcium if you are:  A child undergoing rapid growth.  An adolescent girl.  A pre- or post-menopausal woman.  A woman whose menstrual cycle has stopped due to anorexia nervosa or regular intense exercise.  An individual with lactose intolerance or a milk allergy.  A vegetarian.  What is my plan? Try to consume the recommended amount of calcium daily based on your age. Depending on your overall health, your health care provider may recommend increased calcium intake.General daily calcium intake recommendations by age are:  Birth to 6 months: 200 mg.  Infants 7 to 12 months: 260 mg.  Children 1 to 3 years: 700 mg.  Children 4 to 8 years: 1,000 mg.  Children 9 to 13 years: 1,300 mg.  Teens 14 to 18 years: 1,300 mg.  Adults 19 to 50 years: 1,000 mg.  Adult women 51 to 70 years: 1,200 mg.  Adult men 51 to 70 years: 1,000 mg.  Adults 71 years and older: 1,200 mg.  Pregnant and breastfeeding teens: 1,300 mg.  Pregnant and  breastfeeding adults: 1,000 mg.  What do I need to know about calcium intake?  In order for the body to absorb calcium, it needs vitamin D. You can get vitamin D through (we recommend getting [phone] units of Vitamin D daily) ? Direct exposure of the skin to sunlight. ? Foods, such as egg yolks, liver, saltwater fish, and fortified milk. ? Supplements.  Consuming too much calcium may cause: ? Constipation. ? Decreased absorption of iron and zinc. ? Kidney stones.  Calcium supplements may interact with certain medicines. Check with your health care provider before starting any calcium  supplements.  Try to get most of your calcium from food. What foods can I eat? Grains  Fortified oatmeal. Fortified ready-to-eat cereals. Fortified frozen waffles. Vegetables Turnip greens. Broccoli. Fruits Fortified orange juice. Meats and Other Protein Sources Canned sardines with bones. Canned salmon with bones. Soy beans. Tofu. Baked beans. Almonds. [place] nuts. Sunflower seeds. Dairy Milk. Yogurt. Cheese. Cottage cheese. Beverages Fortified soy milk. Fortified rice milk. Sweets/Desserts Pudding. Ice Cream. Milkshakes. Blackstrap molasses. The items listed above may not be a complete list of recommended foods or beverages. Contact your dietitian for more options. What foods can affect my calcium intake? It may be more difficult for your body to use calcium or calcium may leave your body more quickly if you consume large amounts of:  Sodium.  Protein.  Caffeine.  Alcohol.  This information is not intended to replace advice given to you by your health care provider. Make sure you discuss any questions you have with your health care provider. Document Released: 10/01/2003 Document Revised: 09/06/2015 Document Reviewed: 07/25/2013 Elsevier Interactive Patient Education  2018 [employer].   Perimenopause  Perimenopause is the normal time of life before and after menstrual periods stop completely (menopause). Perimenopause can begin 2-8 years before menopause, and it usually lasts for 1 year after menopause. During perimenopause, the ovaries may or may not produce an egg. What are the causes? This condition is caused by a natural change in hormone levels that happens as you get older. What increases the risk? This condition is more likely to start at an earlier age if you have certain medical conditions or treatments, including:  A tumor of the pituitary gland in the brain.  A disease that affects the ovaries and hormone production.  Radiation treatment for cancer.   Certain cancer treatments, such as chemotherapy or hormone (anti-estrogen) therapy.  Heavy smoking and excessive alcohol use.  Family history of early menopause. What are the signs or symptoms? Perimenopausal changes affect each woman differently. Symptoms of this condition may include:  Hot flashes.  Night sweats.  Irregular menstrual periods.  Decreased sex drive.  Vaginal dryness.  Headaches.  Mood swings.  Depression.  Memory problems or trouble concentrating.  Irritability.  Tiredness.  Weight gain.  Anxiety.  Trouble getting pregnant. How is this diagnosed? This condition is diagnosed based on your medical history, a physical exam, your age, your menstrual history, and your symptoms. Hormone tests may also be done. How is this treated? In some cases, no treatment is needed. You and your health care provider should make a decision together about whether treatment is necessary. Treatment will be based on your individual condition and preferences. Various treatments are available, such as:  Menopausal hormone therapy (MHT).  Medicines to treat specific symptoms.  Acupuncture.  Vitamin or herbal supplements. Before starting treatment, make sure to let your health care provider know if you have a personal or family history  of:  Heart disease.  Breast cancer.  Blood clots.  Diabetes.  Osteoporosis. Follow these instructions at home: Lifestyle  Do not use any products that contain nicotine or tobacco, such as cigarettes and e-cigarettes. If you need help quitting, ask your health care provider.  Eat a balanced diet that includes fresh fruits and vegetables, whole grains, soybeans, eggs, lean meat, and low-fat dairy.  Get at least 30 minutes of physical activity on 5 or more days each week.  Avoid alcoholic and caffeinated beverages, as well as spicy foods. This may help prevent hot flashes.  Get 7-8 hours of sleep each night.  Dress in layers that  can be removed to help you manage hot flashes.  Find ways to manage stress, such as deep breathing, meditation, or journaling. General instructions  Keep track of your menstrual periods, including: ? When they occur. ? How heavy they are and how long they last. ? How much time passes between periods.  Keep track of your symptoms, noting when they start, how often you have them, and how long they last.  Take over-the-counter and prescription medicines only as told by your health care provider.  Take vitamin supplements only as told by your health care provider. These may include calcium, vitamin E, and vitamin D.  Use vaginal lubricants or moisturizers to help with vaginal dryness and improve comfort during sex.  Talk with your health care provider before starting any herbal supplements.  Keep all follow-up visits as told by your health care provider. This is important. This includes any group therapy or counseling. Contact a health care provider if:  You have heavy vaginal bleeding or pass blood clots.  Your period lasts more than 2 days longer than normal.  Your periods are recurring sooner than 21 days.  You bleed after having sex. Get help right away if:  You have chest pain, trouble breathing, or trouble talking.  You have severe depression.  You have pain when you urinate.  You have severe headaches.  You have vision problems. Summary  Perimenopause is the time when a woman's body begins to move into menopause. This may happen naturally or as a result of other health problems or medical treatments.  Perimenopause can begin 2-8 years before menopause, and it usually lasts for 1 year after menopause.  Perimenopausal symptoms can be managed through medicines, lifestyle changes, and complementary therapies such as acupuncture. This information is not intended to replace advice given to you by your health care provider. Make sure you discuss any questions you have with  your health care provider. Document Released: 03/26/2004 Document Revised: 03/24/2016 Document Reviewed: 03/24/2016 Elsevier Interactive Patient Education  2019 [employer].

## 2018-08-05 NOTE — Progress Notes (Signed)
Subjective  Chief Complaint  Patient presents with  . Establish Care    Last CPE was 10-12 yrs ago, sees Dr. Vickey SagesAtkins for OB/GYN.. Failed insurance assessment due to protein in urine  . Annual Exam    Fasting    HPI: Jamie Smith is a 53 y.o. female who presents to [facility] [place] today for a Female Wellness Visit. She also has the concerns and/or needs as listed above in the chief complaint. These will be addressed in addition to the Health Maintenance Visit.   Wellness Visit: annual visit with health maintenance review and exam without Pap. Had recent GYN female wellness; i've reviewed that. 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 Has had some frustrating experiences with physician offices over the years so tends to just "ignore" things. Reports has had some irregular light menses since February, difficulty losing weight in spite of healthy diet, joint pains, and recently "failed" her health insurance exam due to protein in the urine. She thinks most things can be explained by perimenopause but wants to be sure nothing else is going on.   She denies pelvic pain, dysuria, palpitations, changes in skin or nails, heat intolerance, red hot swollen joints, weakness, tremor, low mood or anxiety sxs.   HM: pap and mammo are normal and up to date. Colonoscopy: had 2012 and reports it was normal (Dr. Madilyn FiremanHayes) but he wanted to repeat in 5 years. She does have a FH of colon cancer in her father: he was in his mid 160s. Denies melena or abdominal pain. Regular exercise; athletic most of her life. Healthy diet. Reports 10 pound weight gain recently.  Chronic disease f/u and/or acute problem visit: (deemed necessary to be done in addition to the wellness visit):  Back pain: reviewed xrays: lumbar djd, mild."ignores it". Doesn't take meds.   Right hip pain: chronic bursitis and treated with steroid injection once but had  side effects. Mildly active again. She has recently started walking more.   Joint pains: had seen rheum and ortho: never got MRI or PT or a clear dx. No fh of rheum conditions.   + hot flushes intermittently and missed menses  Assessment  1. Annual physical exam   2. Primary osteoarthritis of both hips   3. Abnormal urinalysis   4. Perimenopausal   5. Family history of colon cancer   6. Greater trochanteric bursitis of right hip      Plan  Female Wellness Visit:  Age appropriate Health Maintenance and Prevention measures were discussed with patient. Included topics are cancer screening recommendations, ways to keep healthy (see AVS) including dietary and exercise recommendations, regular eye and dental care, use of seat belts, and avoidance of moderate alcohol use and tobacco use. Her gyn referred her to [facility] for CRC screen.   BMI: discussed patient's BMI and encouraged positive lifestyle modifications to help get to or maintain a target BMI. Discussed ways to help with weight loss during perimenopausal changes.   HM needs and immunizations were addressed and ordered. See below for orders. See HM and immunization section for updates.  Routine labs and screening tests ordered including cmp, cbc and lipids where appropriate.  Discussed recommendations regarding Vit D and calcium supplementation (see AVS)  Chronic disease management visit and/or acute problem visit:  DJD: supportive care. Consider glucosamine and back exercises/ strength training.   Gr troch bursitis: education and stretching  HO given.   No joint changes c/w rheum arthritis. Reassured.   Perimenopause: discussed expectations. If sxs become problematic, can discuss further options with me or Dr. Renaldo Fiddler.   Proteinuria: suspect non clean catch contaminant. Recheck today and check renal function.   Follow up: Return in about 1 year (around 08/05/2019) for complete physical.  Orders Placed This Encounter  Procedures  .  CBC with Differential/Platelet  . Comprehensive metabolic panel  . Lipid panel  . HIV Antibody (routine testing w rflx)  . TSH  . FSH  . POCT urinalysis dipstick   No orders of the defined types were placed in this encounter.     Lifestyle: Body mass index is 26.92 kg/m. Wt Readings from Last 3 Encounters:  08/05/18 166 lb 12.8 oz (75.7 kg)  07/26/14 159 lb (72.1 kg)  07/05/14 159 lb (72.1 kg)     Patient Active Problem List   Diagnosis Date Noted  . 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 CN (constipation) 06/14/2014  . Psoriasis 06/14/2014  . Degenerative joint disease (DJD) of lumbar spine 06/14/2014    Pelvic & hip xrays reveal deg arthritis bilat hips & lumbar spine    Health Maintenance  Topic Date Due  . INFLUENZA VACCINE  10/01/2018  . MAMMOGRAM  04/03/2019  . COLONOSCOPY  03/02/2020  . PAP SMEAR-Modifier  04/02/2021  . TETANUS/TDAP  06/13/2024  . HIV Screening  Completed   Immunization History  Administered Date(s) Administered  . Tdap 06/14/2014   We updated and reviewed the patient's past history in detail and it is documented below. Allergies: Patient is allergic to penicillins. Past Medical History Patient  has a past medical history of Depression and Psoriasis. Past Surgical History Patient  has no past surgical history on file. Family History: Patient family history includes Alcohol abuse in her paternal grandfather; Cancer in her maternal aunt and paternal grandmother; Cancer (age of onset: 85) in her father; Diabetes in her maternal grandmother; Healthy in her daughter, daughter, sister, and son; Heart disease in her maternal aunt, maternal grandfather, maternal uncle, and paternal uncle; Hyperlipidemia in her mother; Hypertension in her mother; Stroke in her paternal grandmother; Thyroid disease in her mother. Social History:  Patient  reports that she has  never smoked. She has never used smokeless tobacco. She reports current alcohol use of about 2.0 standard drinks of alcohol per week. She reports that she does not use drugs.  Review of Systems: Constitutional: negative for fever or malaise Ophthalmic: negative for photophobia, double vision or loss of vision Cardiovascular: negative for chest pain, dyspnea on exertion, or new LE swelling Respiratory: negative for SOB or persistent cough Gastrointestinal: negative for abdominal pain, change in bowel habits or melena Genitourinary: negative for dysuria or gross hematuria, no abnormal uterine bleeding or disharge Musculoskeletal: negative for new gait disturbance or muscular weakness Integumentary: negative for new or persistent rashes, no breast lumps Neurological: negative for TIA or stroke symptoms Psychiatric: negative for SI or delusions Allergic/Immunologic: negative for hives  Patient Care Team    Relationship Specialty Notifications Start End  Willow Ora, MD PCP - General Family Medicine  08/05/18   Zelphia Cairo, MD Consulting Physician Obstetrics and Gynecology  08/05/18     Objective  Vitals: BP 122/74   Pulse (!) 57   Temp 98 F (36.7 C) (Oral)   Resp 16   Ht 5\' 6"  (1.676  m)   Wt 166 lb 12.8 oz (75.7 kg)   LMP 07/29/2018   SpO2 96%   BMI 26.92 kg/m  General:  Well developed, well nourished, no acute distress  Psych:  Alert and orientedx3,normal mood and affect HEENT:  Normocephalic, atraumatic, non-icteric sclera, PERRL, oropharynx is clear without mass or exudate, supple neck without adenopathy, mass or thyromegaly Cardiovascular:  Normal S1, S2, RRR without gallop, rub or murmur, nondisplaced PMI Respiratory:  Good breath sounds bilaterally, CTAB with normal respiratory effort Gastrointestinal: normal bowel sounds, soft, non-tender, no noted masses. No HSM MSK: no deformities, contusions. Joints are without erythema or swelling. Spine and CVA region are  nontender, tender gr troch bursa on right.  Skin:  Warm, no rashes or suspicious lesions noted Neurologic:    Mental status is normal. CN 2-11 are normal. Gross motor and sensory exams are normal. Normal gait. No tremor    Commons side effects, risks, benefits, and alternatives for medications and treatment plan prescribed today were discussed, and the patient expressed understanding of the given instructions. Patient is instructed to call or message via MyChart if he/she has any questions or concerns regarding our treatment plan. No barriers to understanding were identified. We discussed Red Flag symptoms and signs in detail. Patient expressed understanding regarding what to do in case of urgent or emergency type symptoms.   Medication list was reconciled, printed and provided to the patient in AVS. Patient instructions and summary information was reviewed with the patient as documented in the AVS. This note was prepared with assistance of Dragon voice recognition software. Occasional wrong-word or sound-a-like substitutions may have occurred due to the inherent limitations of voice recognition software

## 2018-08-06 LAB — HIV ANTIBODY (ROUTINE TESTING W REFLEX): HIV 1&2 Ab, 4th Generation: NONREACTIVE

## 2018-08-26 DIAGNOSIS — E559 Vitamin D deficiency, unspecified: Secondary | ICD-10-CM | POA: Diagnosis not present

## 2018-08-26 DIAGNOSIS — R7301 Impaired fasting glucose: Secondary | ICD-10-CM | POA: Diagnosis not present

## 2018-08-26 DIAGNOSIS — R5382 Chronic fatigue, unspecified: Secondary | ICD-10-CM | POA: Diagnosis not present

## 2018-08-26 DIAGNOSIS — N951 Menopausal and female climacteric states: Secondary | ICD-10-CM | POA: Diagnosis not present

## 2018-08-26 DIAGNOSIS — R635 Abnormal weight gain: Secondary | ICD-10-CM | POA: Diagnosis not present

## 2018-08-30 DIAGNOSIS — Z1339 Encounter for screening examination for other mental health and behavioral disorders: Secondary | ICD-10-CM | POA: Diagnosis not present

## 2018-08-30 DIAGNOSIS — E559 Vitamin D deficiency, unspecified: Secondary | ICD-10-CM | POA: Diagnosis not present

## 2018-08-30 DIAGNOSIS — N951 Menopausal and female climacteric states: Secondary | ICD-10-CM | POA: Diagnosis not present

## 2018-08-30 DIAGNOSIS — Z1331 Encounter for screening for depression: Secondary | ICD-10-CM | POA: Diagnosis not present

## 2018-08-30 DIAGNOSIS — R5382 Chronic fatigue, unspecified: Secondary | ICD-10-CM | POA: Diagnosis not present

## 2018-08-30 DIAGNOSIS — R7301 Impaired fasting glucose: Secondary | ICD-10-CM | POA: Diagnosis not present

## 2019-01-04 ENCOUNTER — Telehealth: Payer: Self-pay | Admitting: Family Medicine

## 2019-01-04 ENCOUNTER — Ambulatory Visit: Payer: Self-pay

## 2019-01-04 NOTE — Telephone Encounter (Signed)
[facility] nurse triage called just to inform us that she tested positive for COVID.  She is going to call the health department - she just wanted to inform us of the positive result.  Test was done at [facility].

## 2019-01-04 NOTE — Telephone Encounter (Signed)
FYI

## 2019-01-04 NOTE — Telephone Encounter (Signed)
Patient called after receiving a positive COVID-19 test from [facility].  She was tested Monday. She was informed that she can pass germ to others.  She states her symptoms are headache congestion and feeling tired.  Symptoms tier and criteria for ending isolation were read to patient.  Good preventative practices were discussed. She has family that have now been exposed and have no current symptoms.  She was told that they should quarantine and monitor for symptoms for 14 days. I symptoms develop they need to be tested.  Symptoms typically start 2-14 day after exposure.  She verbalized understanding.  HD will be notified. Dr any office notified I spoke with Colletta [place].  Reason for Disposition . Health Information question, no triage required and triager able to answer question  Answer Assessment - Initial Assessment Questions 1. REASON FOR CALL or QUESTION: "What is your reason for calling today?" or "How can I best help you?" or "What question do you have that I can help answer?"     Patient [facility]. She needs all information on symptoms tier criteria for ending Isolation. Good preventative practices.  Protocols used: INFORMATION ONLY CALL-A-AH

## 2019-01-18 ENCOUNTER — Encounter: Payer: Self-pay | Admitting: Physician Assistant

## 2019-01-18 ENCOUNTER — Other Ambulatory Visit: Payer: Self-pay | Admitting: Physician Assistant

## 2019-01-18 DIAGNOSIS — S83252D Bucket-handle tear of lateral meniscus, current injury, left knee, subsequent encounter: Secondary | ICD-10-CM

## 2019-01-18 HISTORY — DX: Bucket-handle tear of lateral meniscus, current injury, left knee, subsequent encounter: S83.252D

## 2019-01-18 NOTE — H&P (Signed)
Jamie Smith is an 53 y.o. female.   Chief Complaint: right knee lateral meniscus tear HPI: Patient is currently home on quarantine due to a positive COVID test on November 4.  She squatted down and as she was getting up her knee popped and locked.  She presented to the orthopedic urgent care over the weekend with a locked knee.  MRI was ordered and confirmed a bucket handle meniscus tear  Past Medical History:  Diagnosis Date  . Depression   . Psoriasis     No past surgical history on file.  Family History  Problem Relation Age of Onset  . Cancer Father 68       colon, non hodkin lymphoma, liver  . Hypertension Mother   . Hyperlipidemia Mother   . Thyroid disease Mother   . Healthy Sister   . Healthy Daughter   . Healthy Son   . Heart disease Maternal Aunt   . Cancer Maternal Aunt        stomach  . Heart disease Maternal Uncle   . Heart disease Paternal Uncle   . Diabetes Maternal Grandmother   . Heart disease Maternal Grandfather   . Stroke Paternal Grandmother   . Cancer Paternal Grandmother        colon  . Alcohol abuse Paternal Grandfather   . Healthy Daughter    Social History:  reports that she has never smoked. She has never used smokeless tobacco. She reports current alcohol use of about 2.0 standard drinks of alcohol per week. She reports that she does not use drugs.  Allergies:  Allergies  Allergen Reactions  . Penicillins     (Not in a hospital admission)   No results found for this or any previous visit (from the past 48 hour(s)). No results found.  Review of Systems  Constitutional: Negative.   HENT: Negative.   Eyes: Negative.   Respiratory: Negative.   Cardiovascular: Negative.   Gastrointestinal: Negative.   Genitourinary: Negative.   Musculoskeletal: Positive for joint pain.  Skin: Negative.   Neurological: Negative.   Endo/Heme/Allergies: Negative.   Psychiatric/Behavioral: Negative.     There were no vitals taken for this  visit. Physical Exam  Constitutional: She is oriented to person, place, and time. She appears well-developed and well-nourished.  HENT:  Head: Normocephalic and atraumatic.  Mouth/Throat: Oropharynx is clear and moist.  Eyes: Pupils are equal, round, and reactive to light. Conjunctivae are normal.  Neck: Neck supple.  Cardiovascular: Normal rate.  Respiratory: Effort normal.  GI: Soft.  Genitourinary:    Genitourinary Comments: Not pertinent to current symptomatology therefore not examined.   Musculoskeletal:     Comments: Right knee range of motion -20 to 80 degrees.  Moderate lateral pain.  DNVI.  Left knee has full range of motion without pain swelling or deformity  Neurological: She is alert and oriented to person, place, and time.  Skin: Skin is warm and dry.  Psychiatric: She has a normal mood and affect. Her behavior is normal.     Assessment  Left knee bucket handle lateral meniscus tear  Covid positive Nov 4  Plan Left knee arthroscopy with partial lateral meniscectomy.   The risks, benefits, and possible complications of the procedure were discussed in detail with the patient.  The patient is without question.  Alrick Cubbage J Rosalio Catterton, PA-C 01/18/2019, 12:26 PM

## 2019-01-19 ENCOUNTER — Other Ambulatory Visit (HOSPITAL_COMMUNITY): Payer: BLUE CROSS/BLUE SHIELD

## 2019-01-19 ENCOUNTER — Telehealth: Payer: Self-pay | Admitting: Family Medicine

## 2019-01-19 ENCOUNTER — Ambulatory Visit (HOSPITAL_COMMUNITY)
Admission: RE | Admit: 2019-01-19 | Discharge: 2019-01-19 | Disposition: A | Discharge status: Home / Self Care | Payer: BLUE CROSS/BLUE SHIELD | Source: Ambulatory Visit | Attending: Cardiology | Admitting: Cardiology

## 2019-01-19 ENCOUNTER — Encounter (HOSPITAL_COMMUNITY): Payer: Self-pay

## 2019-01-19 ENCOUNTER — Other Ambulatory Visit: Payer: Self-pay

## 2019-01-19 ENCOUNTER — Other Ambulatory Visit (HOSPITAL_COMMUNITY): Payer: Self-pay | Admitting: Orthopedic Surgery

## 2019-01-19 ENCOUNTER — Encounter (HOSPITAL_BASED_OUTPATIENT_CLINIC_OR_DEPARTMENT_OTHER): Payer: Self-pay

## 2019-01-19 ENCOUNTER — Encounter (HOSPITAL_BASED_OUTPATIENT_CLINIC_OR_DEPARTMENT_OTHER)
Admission: RE | Admit: 2019-01-19 | Discharge: 2019-01-19 | Disposition: A | Discharge status: Home / Self Care | Payer: BLUE CROSS/BLUE SHIELD | Source: Ambulatory Visit | Attending: Orthopedic Surgery | Admitting: Orthopedic Surgery

## 2019-01-19 ENCOUNTER — Ambulatory Visit (HOSPITAL_COMMUNITY)
Admission: RE | Admit: 2019-01-19 | Discharge: 2019-01-19 | Disposition: A | Discharge status: Home / Self Care | Payer: BLUE CROSS/BLUE SHIELD | Source: Ambulatory Visit | Attending: Physician Assistant | Admitting: Physician Assistant

## 2019-01-19 ENCOUNTER — Other Ambulatory Visit: Payer: Self-pay | Admitting: Physician Assistant

## 2019-01-19 DIAGNOSIS — R0602 Shortness of breath: Secondary | ICD-10-CM

## 2019-01-19 DIAGNOSIS — M7989 Other specified soft tissue disorders: Secondary | ICD-10-CM | POA: Diagnosis not present

## 2019-01-19 DIAGNOSIS — M79604 Pain in right leg: Secondary | ICD-10-CM | POA: Diagnosis not present

## 2019-01-19 LAB — CBC WITH DIFFERENTIAL/PLATELET
Abs Immature Granulocytes: 0.02 10*3/uL (ref 0.00–0.07)
Basophils Absolute: 0 10*3/uL (ref 0.0–0.1)
Basophils Relative: 0 %
Eosinophils Absolute: 0.1 10*3/uL (ref 0.0–0.5)
Eosinophils Relative: 2 %
HCT: 37.3 % (ref 36.0–46.0)
Hemoglobin: 12.4 g/dL (ref 12.0–15.0)
Immature Granulocytes: 0 %
Lymphocytes Relative: 25 %
Lymphs Abs: 1.5 10*3/uL (ref 0.7–4.0)
MCH: 29.8 pg (ref 26.0–34.0)
MCHC: 33.2 g/dL (ref 30.0–36.0)
MCV: 89.7 fL (ref 80.0–100.0)
Monocytes Absolute: 0.7 10*3/uL (ref 0.1–1.0)
Monocytes Relative: 12 %
Neutro Abs: 3.6 10*3/uL (ref 1.7–7.7)
Neutrophils Relative %: 61 %
Platelets: 258 10*3/uL (ref 150–400)
RBC: 4.16 MIL/uL (ref 3.87–5.11)
RDW: 11.9 % (ref 11.5–15.5)
WBC: 5.9 10*3/uL (ref 4.0–10.5)
nRBC: 0 % (ref 0.0–0.2)

## 2019-01-19 LAB — COMPREHENSIVE METABOLIC PANEL
ALT: 28 U/L (ref 0–44)
AST: 25 U/L (ref 15–41)
Albumin: 3.9 g/dL (ref 3.5–5.0)
Alkaline Phosphatase: 40 U/L (ref 38–126)
Anion gap: 9 (ref 5–15)
BUN: 10 mg/dL (ref 6–20)
CO2: 24 mmol/L (ref 22–32)
Calcium: 9.1 mg/dL (ref 8.9–10.3)
Chloride: 105 mmol/L (ref 98–111)
Creatinine, Ser: 0.86 mg/dL (ref 0.44–1.00)
GFR calc Af Amer: >60 mL/min (ref >60)
GFR calc non Af Amer: >60 mL/min (ref >60)
Glucose, Bld: 96 mg/dL (ref 70–99)
Potassium: 4.3 mmol/L (ref 3.5–5.1)
Sodium: 138 mmol/L (ref 135–145)
Total Bilirubin: 0.3 mg/dL (ref 0.3–1.2)
Total Protein: 6.5 g/dL (ref 6.5–8.1)

## 2019-01-19 LAB — APTT: aPTT: 28 seconds (ref 24–36)

## 2019-01-19 LAB — PROTIME-INR
INR: 0.9 (ref 0.8–1.2)
Prothrombin Time: 11.9 seconds (ref 11.4–15.2)

## 2019-01-19 MED ORDER — IOHEXOL 350 MG/ML SOLN
100.0000 mL | Freq: Once | INTRAVENOUS | Status: AC | PRN
  Administered 2019-01-19: 100 mL via INTRAVENOUS

## 2019-01-19 NOTE — Progress Notes (Signed)

## 2019-01-19 NOTE — Telephone Encounter (Signed)
I received a call from Raliegh Ip, [place].  Dr. Jonni Sanger was out of the office at time of call as it is her administrative half day  Patient was undergoing work-up for knee pain-was found to have bucket-handle tear of meniscus  Apparently she tested positive on November 4 for COVID-19.  She later developed right leg swelling.  She went for venous duplex today and was found to have right DVT.  This is likely provoked by COVID-19.  She had a CT scan done to rule out pulmonary embolism-none was found but she did have pneumonia noted-though this could be related to the COVID-19.  PA plans to start patient on Xarelto 15 mg twice a day after our discussion.  She will also start patient on doxycycline for pneumonia though I told her this could be related to COVID-19.  If patient has new or worsening symptoms needs to seek care in the emergency room immediately-she will be counseled by Raliegh Ip.  We also set up 2-week follow-up with Dr. Jonni Sanger for follow-up and help with transition to Xarelto 20 mg daily for ongoing treatment.

## 2019-01-19 NOTE — Progress Notes (Signed)
Right lower extremity venous duplex completed. Evidence of acute DVT in the right posterior tibial and peroneal veins. See final report under Chart Review- [facility]. Spoke with Matthew Saras, PA-C, who spoke directly with patient regarding next instructions.

## 2019-01-20 ENCOUNTER — Encounter: Payer: Self-pay | Admitting: Family Medicine

## 2019-01-20 ENCOUNTER — Ambulatory Visit: Payer: Self-pay | Admitting: *Deleted

## 2019-01-20 DIAGNOSIS — I82409 Acute embolism and thrombosis of unspecified deep veins of unspecified lower extremity: Secondary | ICD-10-CM

## 2019-01-20 HISTORY — DX: Acute embolism and thrombosis of unspecified deep veins of unspecified lower extremity: I82.409

## 2019-01-20 NOTE — Telephone Encounter (Signed)
Summary: please advise   Pt is calling and has been dx with covid pneumonia and would like to know if that is contagious      Call to patient- patient was diagnosed with COVID pneumonia. She wants to know if she is contagious to others- advised patient normally no- but will check with PCP. Patient advised to continue safe practices: washing hands frequently, cough or sneeze into tissue- throw away immediately and wash hands, do not share cups/utensils with others. Patient is taking antibiotic twice a day- she will take with food to decrease stomach upset.  Reason for Disposition . Pneumonia, questions about  Answer Assessment - Initial Assessment Questions 1. SYMPTOMS: "What symptoms are you most concerned about?" "Is it better, the same, or worse compared to when you saw the doctor?"     No symptoms- patient was surprised by diagnosis- she did have some SOB 2. BREATHING DIFFICULTY: "Are you having any difficulty breathing?" If so, ask "How bad is it?"  (e.g., none, mild, moderate, severe)   - MILD: No SOB at rest, mild SOB with walking, speaks normally in sentences, can lay down, no retractions, pulse < 100.    - MODERATE: SOB at rest, SOB with minimal exertion and prefers to sit, cannot lie down flat, speaks in phrases, mild retractions, audible wheezing, pulse 100-120.    - SEVERE: Very SOB at rest, speaks in single words, struggling to breathe, sitting hunched forward, retractions, pulse > 120     mild 3. FEVER: "Do you have a fever?" If so, ask: "What is your temperature, how was it measured, and when did it start?"     No fever 4. SPUTUM: "Describe the color of your sputum" (clear, white, yellow, green, blood-tinged)     n/a 5. DIAGNOSIS CONFIRMATION: "When was the pneumonia diagnosed?" "By whom?"     Chest CT scan- hospital 6. ANTIBIOTIC: "Are you taking an antibiotic?"  If so, "Which one?" "When was it started?"     Yes- Doxycycline bid 7. OTHER TREATMENT: "Are you receiving any  other treatment for the pneumonia?" (e.g., albuterol nebulizer, oxygen) If so, ask, "How often?" and "Do they help?"     no 8. HOSPITAL ADMISSION: "Were you hospitalized for this pneumonia?" If so, ask "When were you discharged home from the hospital?"     no  Protocols used: PNEUMONIA ON ANTIBIOTIC FOLLOW-UP CALL-A-AH

## 2019-01-20 NOTE — Telephone Encounter (Signed)
See note

## 2019-01-20 NOTE — Telephone Encounter (Signed)
Spoke with patient and relayed below message. Patient verbalized understanding.

## 2019-01-20 NOTE — Telephone Encounter (Signed)
Please advise.  Per care everywhere, patient had a positive COVID test on 01/02/2019.

## 2019-01-20 NOTE — Telephone Encounter (Signed)
Thanks Dr. Hunter! :) 

## 2019-01-20 NOTE — Telephone Encounter (Signed)
From chart review, her cxr showed pneumonia but she is no longer having respiratory symptoms; these changes could be from covid earlier this month. Once she is 10 days out from having covid sxs (cough, fever, sob etc), then she should no longer be contagious. I suspect these xray findings are lingering changes from her prior infection and she is no longer contagious.  She should continue to practice social distancing, wearing a mask and frequent hand washing.

## 2019-01-25 ENCOUNTER — Encounter (HOSPITAL_BASED_OUTPATIENT_CLINIC_OR_DEPARTMENT_OTHER): Payer: Self-pay

## 2019-01-25 ENCOUNTER — Ambulatory Visit: Payer: Self-pay

## 2019-01-25 ENCOUNTER — Other Ambulatory Visit: Payer: Self-pay

## 2019-01-25 ENCOUNTER — Ambulatory Visit: Payer: BLUE CROSS/BLUE SHIELD | Admitting: Physician Assistant

## 2019-01-25 ENCOUNTER — Emergency Department (HOSPITAL_BASED_OUTPATIENT_CLINIC_OR_DEPARTMENT_OTHER)
Admission: EM | Admit: 2019-01-25 | Discharge: 2019-01-25 | Disposition: A | Discharge status: Home / Self Care | Payer: BLUE CROSS/BLUE SHIELD | Attending: Emergency Medicine | Admitting: Emergency Medicine

## 2019-01-25 ENCOUNTER — Emergency Department (HOSPITAL_BASED_OUTPATIENT_CLINIC_OR_DEPARTMENT_OTHER): Payer: BLUE CROSS/BLUE SHIELD

## 2019-01-25 ENCOUNTER — Telehealth: Payer: Self-pay

## 2019-01-25 DIAGNOSIS — R002 Palpitations: Secondary | ICD-10-CM | POA: Insufficient documentation

## 2019-01-25 DIAGNOSIS — R0602 Shortness of breath: Secondary | ICD-10-CM | POA: Insufficient documentation

## 2019-01-25 DIAGNOSIS — R000 Tachycardia, unspecified: Secondary | ICD-10-CM | POA: Diagnosis not present

## 2019-01-25 LAB — CBC WITH DIFFERENTIAL/PLATELET
Abs Immature Granulocytes: 0.01 10*3/uL (ref 0.00–0.07)
Basophils Absolute: 0 10*3/uL (ref 0.0–0.1)
Basophils Relative: 0 %
Eosinophils Absolute: 0 10*3/uL (ref 0.0–0.5)
Eosinophils Relative: 0 %
HCT: 38.5 % (ref 36.0–46.0)
Hemoglobin: 12.7 g/dL (ref 12.0–15.0)
Immature Granulocytes: 0 %
Lymphocytes Relative: 28 %
Lymphs Abs: 1.4 10*3/uL (ref 0.7–4.0)
MCH: 29.5 pg (ref 26.0–34.0)
MCHC: 33 g/dL (ref 30.0–36.0)
MCV: 89.5 fL (ref 80.0–100.0)
Monocytes Absolute: 0.4 10*3/uL (ref 0.1–1.0)
Monocytes Relative: 9 %
Neutro Abs: 3.2 10*3/uL (ref 1.7–7.7)
Neutrophils Relative %: 63 %
Platelets: 288 10*3/uL (ref 150–400)
RBC: 4.3 MIL/uL (ref 3.87–5.11)
RDW: 12.2 % (ref 11.5–15.5)
WBC: 5.1 10*3/uL (ref 4.0–10.5)
nRBC: 0 % (ref 0.0–0.2)

## 2019-01-25 LAB — BASIC METABOLIC PANEL
Anion gap: 12 (ref 5–15)
BUN: 16 mg/dL (ref 6–20)
CO2: 23 mmol/L (ref 22–32)
Calcium: 9.1 mg/dL (ref 8.9–10.3)
Chloride: 105 mmol/L (ref 98–111)
Creatinine, Ser: 0.84 mg/dL (ref 0.44–1.00)
GFR calc Af Amer: >60 mL/min (ref >60)
GFR calc non Af Amer: >60 mL/min (ref >60)
Glucose, Bld: 100 mg/dL — ABNORMAL HIGH (ref 70–99)
Potassium: 3.7 mmol/L (ref 3.5–5.1)
Sodium: 140 mmol/L (ref 135–145)

## 2019-01-25 LAB — PROTIME-INR
INR: 1.8 — ABNORMAL HIGH (ref 0.8–1.2)
Prothrombin Time: 20.4 seconds — ABNORMAL HIGH (ref 11.4–15.2)

## 2019-01-25 LAB — BRAIN NATRIURETIC PEPTIDE: B Natriuretic Peptide: 33 pg/mL (ref 0.0–100.0)

## 2019-01-25 LAB — TROPONIN I (HIGH SENSITIVITY): Troponin I (High Sensitivity): 3 ng/L (ref <18)

## 2019-01-25 MED ORDER — IOHEXOL 350 MG/ML SOLN
100.0000 mL | Freq: Once | INTRAVENOUS | Status: AC | PRN
  Administered 2019-01-25: 100 mL via INTRAVENOUS

## 2019-01-25 NOTE — Telephone Encounter (Signed)
See note

## 2019-01-25 NOTE — Telephone Encounter (Signed)
Pt. Reports last night she started having some shortness of breath, comes and goes.No chest pain. Started last night around 9:30. Feels like she has to take a deep breath through her mouth to get "air in." Feels "jittery.I don't feel right." Requests a visit. Warm transfer to [facility] in the practice. Answer Assessment - Initial Assessment Questions 1. RESPIRATORY STATUS: "Describe your breathing?" (e.g., wheezing, shortness of breath, unable to speak, severe coughing)      Shortness of breath 2. ONSET: "When did this breathing problem begin?"      9:30 last night 3. PATTERN "Does the difficult breathing come and go, or has it been constant since it started?"      Comes and goes 4. SEVERITY: "How bad is your breathing?" (e.g., mild, moderate, severe)    - MILD: No SOB at rest, mild SOB with walking, speaks normally in sentences, can lay down, no retractions, pulse < 100.    - MODERATE: SOB at rest, SOB with minimal exertion and prefers to sit, cannot lie down flat, speaks in phrases, mild retractions, audible wheezing, pulse 100-120.    - SEVERE: Very SOB at rest, speaks in single words, struggling to breathe, sitting hunched forward, retractions, pulse > 120      Moderate 5. RECURRENT SYMPTOM: "Have you had difficulty breathing before?" If so, ask: "When was the last time?" and "What happened that time?"      No 6. CARDIAC HISTORY: "Do you have any history of heart disease?" (e.g., heart attack, angina, bypass surgery, angioplasty)      No 7. LUNG HISTORY: "Do you have any history of lung disease?"  (e.g., pulmonary embolus, asthma, emphysema)     DVT 8. CAUSE: "What do you think is causing the breathing problem?"      Unsure 9. OTHER SYMPTOMS: "Do you have any other symptoms? (e.g., dizziness, runny nose, cough, chest pain, fever)     Some dull sensation in her chest 10. PREGNANCY: "Is there any chance you are pregnant?" "When was your last menstrual period?"       No 11. TRAVEL: "Have you  traveled out of the country in the last month?" (e.g., travel history, exposures)       No  Protocols used: BREATHING DIFFICULTY-A-AH

## 2019-01-25 NOTE — ED Provider Notes (Signed)
[facility] EMERGENCY DEPARTMENT Provider Note   CSN: [id number] Arrival date & time: 01/25/19  1114     History   Chief Complaint Chief Complaint  Patient presents with  . Shortness of Breath    HPI Jamie Smith is a 53 y.o. female.  She is presenting with complaint of increased shortness of breath, palpitations and tachycardia that started last evening.  She was diagnosed with Covid earlier this month.  She was seen by orthopedics for a knee injury and calf pain and diagnosed with meniscal tear and more importantly a DVT of her right leg.  She was placed on anticoagulation.  She also received a CT chest a week ago that did not show any PE but did show some signs of pneumonia.  She has been on doxycycline.  She said at that time she was not having any shortness of breath.     The history is provided by the patient.  Shortness of Breath Severity:  Moderate Onset quality:  Gradual Duration:  18 hours Timing:  Constant Progression:  Unchanged Chronicity:  New Context: activity   Relieved by:  Nothing Worsened by:  Activity Ineffective treatments:  Rest Associated symptoms: no abdominal pain, no chest pain, no cough, no fever, no headaches, no rash, no sore throat, no sputum production, no vomiting and no wheezing   Risk factors: hx of PE/DVT     Past Medical History:  Diagnosis Date  . Bucket handle tear of lateral meniscus of left knee, subsequent encounter 01/18/2019  . Depression   . DVT (deep venous thrombosis) ([place]) 01/20/2019   Jan 18, 2019: covid and decreased activity due to meniscal injury. xarelto started.   . Psoriasis     Patient Active Problem List   Diagnosis Date Noted  . DVT (deep venous thrombosis) ([place]) 01/20/2019  . Bucket handle tear of lateral meniscus of left knee, subsequent encounter 01/18/2019  . 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 CN  (constipation) 06/14/2014  . Psoriasis 06/14/2014  . 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 No pertinent surgical history.   OB History   No obstetric history on file.      Home Medications    Prior to Admission medications   Medication Sig Start Date End Date Taking? Authorizing Provider  doxycycline (VIBRA-TABS) 100 MG tablet Take 100 mg by mouth 2 (two) times daily. 01/19/19   [provider]  Progesterone Micronized (PROGESTERONE PO) Take by mouth.    [provider]  XARELTO 15 MG TABS tablet Take 15 mg by mouth 2 (two) times daily. 01/19/19   [provider]    Family History Family History  Problem Relation Age of Onset  . Cancer Father 71       colon, non hodkin lymphoma, liver  . Hypertension Mother   . Hyperlipidemia Mother   . Thyroid disease Mother   . Healthy Sister   . Healthy Daughter   . Healthy Son   . Heart disease Maternal Aunt   . Cancer Maternal Aunt        stomach  . Heart disease Maternal Uncle   . Heart disease Paternal Uncle   . Diabetes Maternal Grandmother   . Heart disease Maternal Grandfather   . Stroke Paternal Grandmother   . Cancer Paternal Grandmother        colon  .  Alcohol abuse Paternal Grandfather   . Healthy Daughter     Social History Social History   Tobacco Use  . Smoking status: Never Smoker  . Smokeless tobacco: Never Used  Substance Use Topics  . Alcohol use: Yes    Alcohol/week: 2.0 standard drinks    Types: 2 Glasses of wine per week    Comment: a couple drinks a week   . Drug use: Never     Allergies   Penicillins   Review of Systems Review of Systems  Constitutional: Negative for fever.  HENT: Negative for sore throat.   Eyes: Negative for visual disturbance.  Respiratory: Positive for shortness of breath. Negative for cough, sputum production and wheezing.   Cardiovascular: Negative for chest pain.  Gastrointestinal: Negative  for abdominal pain and vomiting.  Genitourinary: Negative for dysuria.  Musculoskeletal: Positive for gait problem.  Skin: Negative for rash.  Neurological: Positive for tremors. Negative for headaches.     Physical Exam Updated Vital Signs BP 111/66   Pulse 64   Resp 17   Ht 5\' 6"  (1.676 m)   Wt 74.8 kg   LMP 10/06/2018   SpO2 100%   BMI 26.63 kg/m   Physical Exam Vitals signs and nursing note reviewed.  Constitutional:      General: She is not in acute distress.    Appearance: She is well-developed.  HENT:     Head: Normocephalic and atraumatic.  Eyes:     Conjunctiva/sclera: Conjunctivae normal.  Neck:     Musculoskeletal: Neck supple.  Cardiovascular:     Rate and Rhythm: Normal rate and regular rhythm.     Heart sounds: No murmur.  Pulmonary:     Effort: Pulmonary effort is normal. No respiratory distress.     Breath sounds: Normal breath sounds.  Abdominal:     Palpations: Abdomen is soft.     Tenderness: There is no abdominal tenderness.  Musculoskeletal:     Right lower leg: She exhibits no tenderness. Edema (trace ankle) present.     Left lower leg: She exhibits no tenderness.  Skin:    General: Skin is warm and dry.     Capillary Refill: Capillary refill takes less than 2 seconds.  Neurological:     General: No focal deficit present.     Mental Status: She is alert.      ED Treatments / Results  Labs (all labs ordered are listed, but only abnormal results are displayed) Labs Reviewed  PROTIME-INR - Abnormal; Notable for the following components:      Result Value   Prothrombin Time 20.4 (*)    INR 1.8 (*)    All other components within normal limits  BASIC METABOLIC PANEL - Abnormal; Notable for the following components:   Glucose, Bld 100 (*)    All other components within normal limits  CBC WITH DIFFERENTIAL/PLATELET  BRAIN NATRIURETIC PEPTIDE  TROPONIN I (HIGH SENSITIVITY)    EKG None  Radiology Ct Angio Chest Pe W/cm &/or Wo Cm   Result Date: 01/25/2019 CLINICAL DATA:  53 year old female with shortness of breath and chest pain. EXAM: CT ANGIOGRAPHY CHEST WITH CONTRAST TECHNIQUE: Multidetector CT imaging of the chest was performed using the standard protocol during bolus administration of intravenous contrast. Multiplanar CT image reconstructions and MIPs were obtained to evaluate the vascular anatomy. CONTRAST:  44 OMNIPAQUE IOHEXOL 350 MG/ML SOLN COMPARISON:  Chest CT dated 01/19/2019. FINDINGS: Cardiovascular: There is no cardiomegaly or pericardial effusion. The thoracic aorta is unremarkable.  There is no CT evidence of pulmonary embolism. Mediastinum/Nodes: No hilar or mediastinal adenopathy. Esophagus and the thyroid gland are grossly unremarkable. No mediastinal fluid collection. Lungs/Pleura: There is a 3 mm left lower lobe subpleural nodule similar to prior CT. The lungs are otherwise clear. There is no pleural effusion or pneumothorax. The central airways are patent. Upper Abdomen: No acute abnormality. Musculoskeletal: No chest wall abnormality. No acute or significant osseous findings. Review of the MIP images confirms the above findings. IMPRESSION: No acute intrathoracic pathology. No CT evidence of pulmonary embolism. Electronically Signed   By: Elgie CollardArash  Radparvar M.D.   On: 01/25/2019 12:47    Procedures Procedures (including critical care time)  Medications Ordered in ED Medications - No data to display   Initial Impression / Assessment and Plan / ED Course  I have reviewed the triage vital signs and the nursing notes.  Pertinent labs & imaging results that were available during my care of the patient were reviewed by me and considered in my medical decision making (see chart for details).  Clinical Course as of Jan 25 1704  Wed Jan 25, 2019  [id number] year old female with recent diagnosis of Covid, DVT, and pneumonia here with worsening shortness of breath, palpitations and tremulousness since last evening.   Differential includes Covid, pneumonia, PE, metabolic derangement, ACS   [MB]  1222 EKG interpreted by me as sinus rhythm rate of 70 no acute ST-T changes.   [MB]    Clinical Course User Index [MB] Terrilee FilesButler, Naasia Weilbacher C, MD   Eldred MangesLorrie D Montesdeoca was evaluated in Emergency Department on 01/25/2019 for the symptoms described in the history of present illness. She was evaluated in the context of the global COVID-19 pandemic, which necessitated consideration that the patient might be at risk for infection with the SARS-CoV-2 virus that causes COVID-19. Institutional protocols and algorithms that pertain to the evaluation of patients at risk for COVID-19 are in a state of rapid change based on information released by regulatory bodies including the CDC and federal and state organizations. These policies and algorithms were followed during the patient's care in the ED.      Final Clinical Impressions(s) / ED Diagnoses   Final diagnoses:  SOB (shortness of breath)    ED Discharge Orders    None       Terrilee FilesButler, Nour Rodrigues C, MD 01/25/19 1705

## 2019-01-25 NOTE — ED Triage Notes (Signed)
Pt reports injury to right leg earlier this year, reprots that she developed Covid Nov 4th, and a blood clot in calf on Nov 19th and pneumonia. Pt rpeorts increased SOB

## 2019-01-25 NOTE — ED Notes (Signed)
Pt on monitor 

## 2019-01-25 NOTE — Telephone Encounter (Signed)
Agree with assessment and plan, needs to go to ER urgently, per prior provider's recommendation.

## 2019-01-25 NOTE — Discharge Instructions (Addendum)
You were seen in the emergency department for evaluation of increased shortness of breath.  You had blood work EKG and a CAT scan of your chest that did not show any serious findings.  It will be important for you to follow-up with your doctor for continued work-up of your symptoms.  Return to the emergency department if any acute worsening.

## 2019-01-25 NOTE — Telephone Encounter (Signed)
Call transferred to me from [facility]. Patient was dx with DVT and pneumonia from ortho. She was having increased heart rate. Per patient. I had scheduled on Samantha. After I finished the call spoke with [facility] and reviewed chart further. Last note was from hunter if patient had any change in symptoms needs to go to ED. I have called and let patient know before I could finish information patient disconnected call before I could give all information.  I have canceled her appointment.

## 2019-01-25 NOTE — Telephone Encounter (Signed)
Patient is being seen for doxy visit with Inda Coke, PA-C today 11/25 @ 11:40.

## 2019-01-31 ENCOUNTER — Encounter: Payer: Self-pay | Admitting: Family Medicine

## 2019-01-31 ENCOUNTER — Ambulatory Visit (INDEPENDENT_AMBULATORY_CARE_PROVIDER_SITE_OTHER): Payer: BLUE CROSS/BLUE SHIELD | Admitting: Family Medicine

## 2019-01-31 ENCOUNTER — Other Ambulatory Visit: Payer: Self-pay

## 2019-01-31 VITALS — BP 118/60 | HR 70 | Temp 98.1°F | Ht 66.0 in | Wt 173.4 lb

## 2019-01-31 DIAGNOSIS — U071 COVID-19: Secondary | ICD-10-CM

## 2019-01-31 DIAGNOSIS — S838X1D Sprain of other specified parts of right knee, subsequent encounter: Secondary | ICD-10-CM

## 2019-01-31 DIAGNOSIS — I8290 Acute embolism and thrombosis of unspecified vein: Secondary | ICD-10-CM

## 2019-01-31 DIAGNOSIS — R5383 Other fatigue: Secondary | ICD-10-CM | POA: Diagnosis not present

## 2019-01-31 NOTE — Progress Notes (Signed)
Subjective  CC:  Chief Complaint  Patient presents with  . dvt    R leg  . Pneumonia    HPI: Jamie Smith is a 53 y.o. female who presents to the office today to address the problems listed above in the chief complaint.  53 yo female who had covid-19 dxd on nov 4th: reports mild to mod respiratory sxs with some fatigue but most sxs resolved; still very fatigued. Subsequently squatted and had acute tear of meniscus on right; then a right DVT was dxd. I reviewed the records from ortho. Last week, she acutely felt more sob; ER evaluation was thorough and did not find PE, PNA (clear chest CT), nl vitals, and reassuring labs including bnp, cbc, cmp. Pt is very frustrated. Feels sob and easily winded. Gets very fatigued easily.  She is on xarelto. She is working from home > 40 hours/week, her work wants her to return. She was written out until today. She does not feel she could meed the demands of returning to workplace fulltime at this time; however, this stresses her greatly. 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   Right leg pain: scheduled deferred until 12/15. She is medically cleared for this.   ROS: neg tachy, cp, f/c/s; + fatigue and lightheadedness.   Assessment  1. Deep vein thrombosis (DVT) associated with COVID-19   2. Meniscal injury, right, subsequent encounter   3. Other fatigue      Plan   covid related dvt/fatigue:  Reassured. She is stable from a cardiopulmonary standpoint. No active PNA or PE now. But deconditioned from prolonged illness with complications. Counseling done. Needs rest and supportive care. I favor reducing work hours, working from home in order to recover prior to her surgery. rec FMLA. Close f/u  Follow up:  2 weeks for recheck  Visit date not found  No orders of the defined types were placed in this encounter.  No orders of the defined types were placed in this encounter.     I reviewed the patients updated PMH, FH, and SocHx.     Patient Active Problem List   Diagnosis Date Noted  . DVT (deep venous thrombosis) ([place]) 01/20/2019  . Bucket handle tear of lateral meniscus of left knee, subsequent encounter 01/18/2019  . 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 CN (constipation) 06/14/2014  . Psoriasis 06/14/2014  . 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 doxycycline (VIBRA-TABS) 100 MG tablet Take 100 mg by mouth 2 (two) times daily.  . Progesterone Micronized (PROGESTERONE PO) Take by mouth.  Alveda Reasons 15 MG TABS tablet Take 15 mg by mouth 2 (two) times daily.    Allergies: Patient is allergic to penicillins. Family History: Patient family history includes Alcohol abuse in her paternal grandfather; Cancer in her maternal aunt and paternal grandmother; Cancer (age of onset: 76) in her father; Diabetes in her maternal grandmother; Healthy in her daughter, daughter, sister, and son; Heart disease in her maternal aunt, maternal grandfather, maternal uncle, and paternal uncle; Hyperlipidemia in her mother; Hypertension in her mother; Stroke in her paternal grandmother; Thyroid disease in her mother. Social History:  Patient  reports that she has never smoked. She has never used smokeless tobacco. She reports current alcohol use of about 2.0 standard drinks of alcohol per week. She reports that  she does not use drugs.  Review of Systems: Constitutional: Negative for fever malaise or anorexia Cardiovascular: negative for chest pain Respiratory: negative for SOB or persistent cough Gastrointestinal: negative for abdominal pain  Objective  Vitals: BP 118/60 (BP Location: Left Arm, Patient Position: Sitting, Cuff Size: Normal)   Pulse 70   Temp 98.1 F (36.7 C) (Skin)   Ht 5\' 6"  (1.676 m)   Wt 173 lb 6.4 oz (78.7 kg)   SpO2 98%   BMI 27.99 kg/m  General: no acute distress  but obviously frustrated and tearful at times, A&Ox3, no respiratory distress, anxious appearing at times HEENT: PEERL, conjunctiva normal, Oropharynx moist,neck is supple Cardiovascular:  RRR without murmur or gallop.  Respiratory:  Good breath sounds bilaterally, CTAB with normal respiratory effort Skin:  Warm, no rashes Right knee: limp  No visits with results within 1 Day(s) from this visit.  Latest known visit with results is:  Admission on 01/25/2019, Discharged on 01/25/2019  Component Date Value Ref Range Status  . WBC 01/25/2019 5.1  4.0 - 10.5 K/uL Final  . RBC 01/25/2019 4.30  3.87 - 5.11 MIL/uL Final  . Hemoglobin 01/25/2019 12.7  12.0 - 15.0 g/dL Final  . HCT 01/27/2019 38.5  36.0 - 46.0 % Final  . MCV 01/25/2019 89.5  80.0 - 100.0 fL Final  . MCH 01/25/2019 29.5  26.0 - 34.0 pg Final  . MCHC 01/25/2019 33.0  30.0 - 36.0 g/dL Final  . RDW 01/27/2019 12.2  11.5 - 15.5 % Final  . Platelets 01/25/2019 288  150 - 400 K/uL Final  . nRBC 01/25/2019 0.0  0.0 - 0.2 % Final  . Neutrophils Relative % 01/25/2019 63  % Final  . Neutro Abs 01/25/2019 3.2  1.7 - 7.7 K/uL Final  . Lymphocytes Relative 01/25/2019 28  % Final  . Lymphs Abs 01/25/2019 1.4  0.7 - 4.0 K/uL Final  . Monocytes Relative 01/25/2019 9  % Final  . Monocytes Absolute 01/25/2019 0.4  0.1 - 1.0 K/uL Final  . Eosinophils Relative 01/25/2019 0  % Final  . Eosinophils Absolute 01/25/2019 0.0  0.0 - 0.5 K/uL Final  . Basophils Relative 01/25/2019 0  % Final  . Basophils Absolute 01/25/2019 0.0  0.0 - 0.1 K/uL Final  . Immature Granulocytes 01/25/2019 0  % Final  . Abs Immature Granulocytes 01/25/2019 0.01  0.00 - 0.07 K/uL Final  . Prothrombin Time 01/25/2019 20.4* 11.4 - 15.2 seconds Final  . INR 01/25/2019 1.8* 0.8 - 1.2 Final  . Sodium 01/25/2019 140  135 - 145 mmol/L Final  . Potassium 01/25/2019 3.7  3.5 - 5.1 mmol/L Final  . Chloride 01/25/2019 105  98 - 111 mmol/L Final  . CO2 01/25/2019 23  22 - 32 mmol/L  Final  . Glucose, Bld 01/25/2019 100* 70 - 99 mg/dL Final  . BUN 01/27/2019 16  6 - 20 mg/dL Final  . Creatinine, Ser 01/25/2019 0.84  0.44 - 1.00 mg/dL Final  . Calcium 01/27/2019 9.1  8.9 - 10.3 mg/dL Final  . GFR calc non Af Amer 01/25/2019 >60  >60 mL/min Final  . GFR calc Af Amer 01/25/2019 >60  >60 mL/min Final  . Anion gap 01/25/2019 12  5 - 15 Final  . Troponin I (High Sensitivity) 01/25/2019 3  <18 ng/L Final  . B Natriuretic Peptide 01/25/2019 33.0  0.0 - 100.0 pg/mL Final      Commons side effects, risks, benefits, and alternatives for medications and treatment plan  prescribed today were discussed, and the patient expressed understanding of the given instructions. Patient is instructed to call or message via MyChart if he/she has any questions or concerns regarding our treatment plan. No barriers to understanding were identified. We discussed Red Flag symptoms and signs in detail. Patient expressed understanding regarding what to do in case of urgent or emergency type symptoms.   Medication list was reconciled, printed and provided to the patient in AVS. Patient instructions and summary information was reviewed with the patient as documented in the AVS. This note was prepared with assistance of Dragon voice recognition software. Occasional wrong-word or sound-a-like substitutions may have occurred due to the inherent limitations of voice recognition software  This visit occurred during the SARS-CoV-2 public health emergency.  Safety protocols were in place, including screening questions prior to the visit, additional usage of staff PPE, and extensive cleaning of exam room while observing appropriate contact time as indicated for disinfecting solutions.

## 2019-02-01 ENCOUNTER — Encounter: Payer: Self-pay | Admitting: Family Medicine

## 2019-02-09 ENCOUNTER — Other Ambulatory Visit: Payer: Self-pay

## 2019-02-10 ENCOUNTER — Ambulatory Visit (INDEPENDENT_AMBULATORY_CARE_PROVIDER_SITE_OTHER): Payer: BLUE CROSS/BLUE SHIELD | Admitting: Family Medicine

## 2019-02-10 ENCOUNTER — Encounter: Payer: Self-pay | Admitting: Family Medicine

## 2019-02-10 VITALS — BP 116/70 | HR 73 | Temp 98.1°F | Ht 66.0 in | Wt 170.0 lb

## 2019-02-10 DIAGNOSIS — S838X1D Sprain of other specified parts of right knee, subsequent encounter: Secondary | ICD-10-CM

## 2019-02-10 DIAGNOSIS — N951 Menopausal and female climacteric states: Secondary | ICD-10-CM | POA: Diagnosis not present

## 2019-02-10 DIAGNOSIS — R5383 Other fatigue: Secondary | ICD-10-CM | POA: Diagnosis not present

## 2019-02-10 DIAGNOSIS — I8290 Acute embolism and thrombosis of unspecified vein: Secondary | ICD-10-CM

## 2019-02-10 DIAGNOSIS — U071 COVID-19: Secondary | ICD-10-CM | POA: Diagnosis not present

## 2019-02-10 NOTE — Progress Notes (Signed)
Subjective  CC:  Chief Complaint  Patient presents with  . DVT  . Fatigue  . Shortness of Breath    HPI: Jamie Smith is a 53 y.o. female who presents to the office today to address the problems listed above in the chief complaint.  Short term f/u for covid related DVT and fatigue requiring restricted work schedule: overall is she is slowly improving. Still with intermittent fatigue and occasional sensation of having a hard time breathing, but stable on xarelto w/o signs of bleeding; no worrisome or worsening right calf swelling but will have occ soreness. Right meniscal tear sxs are improving overall: no further locking and less pain. Ambulation is improved and she is starting to try driving again. Working 1/2 time from home has helped her improve, both physically and mentally.   Weight gain: struggling with weight gain. Frustrated: during October walked 4-5 miles daily w/o weight loss. Reports healthy diet.   Perimenopausal: had been on progesterone/testosterone and supplements. ? Can restart progesterone.   Meniscal repair: was scheduled for repair next week, but surgeon and pt are now thinking about postponing; she would like my thoughts as well.   Assessment  1. Deep vein thrombosis (DVT) associated with COVID-19   2. Meniscal injury, right, subsequent encounter   3. Other fatigue   4. Perimenopausal      Plan   Covid/dvt/fatigue:  Overall, improving. Stay the course. May return to work in January.   DVT: will need 3 months of xarelto. No complications at this time.  Meniscus injury: agree with 3 month postponing of surgery due to her current level of deconditioning/not fully recovered from covid, higher risk status due to recent dvt on xarelto, and fragile mental state due to stress of illness. Pt will contact surgeons office.   Perimenopause: hold progesterone due to recent DVT. Discussed perimenopausal metabolic changes that can make weight loss more challenging. Will  discuss further once she is feeling better.   Follow up: 6-12 weeks for recheck.  Visit date not found  No orders of the defined types were placed in this encounter.  No orders of the defined types were placed in this encounter.     I reviewed the patients updated PMH, FH, and SocHx.    Patient Active Problem List   Diagnosis Date Noted  . DVT (deep venous thrombosis) ([facility]) 01/20/2019  . Bucket handle tear of lateral meniscus of left knee, subsequent encounter 01/18/2019  . 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 CN (constipation) 06/14/2014  . Psoriasis 06/14/2014  . 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 XARELTO 15 MG TABS tablet Take 20 mg by mouth daily.   . [DISCONTINUED] doxycycline (VIBRA-TABS) 100 MG tablet Take 100 mg by mouth 2 (two) times daily.    Allergies: Patient is allergic to penicillins. Family History: Patient family history includes Alcohol abuse in her paternal grandfather; Cancer in her maternal aunt and paternal grandmother; Cancer (age of onset: 73) in her father; Diabetes in her maternal grandmother; Healthy in her daughter, daughter, sister, and son; Heart disease in her maternal aunt, maternal grandfather, maternal uncle, and paternal uncle; Hyperlipidemia in her mother; Hypertension in her mother; Stroke in her paternal grandmother; Thyroid disease in her mother. Social History:  Patient  reports that she has never smoked. She has never used smokeless tobacco. She  reports current alcohol use of about 2.0 standard drinks of alcohol per week. She reports that she does not use drugs.  Review of Systems: Constitutional: Negative for fever malaise or anorexia Cardiovascular: negative for chest pain Respiratory: +for SOB , neg persistent cough Gastrointestinal: negative for abdominal pain Neg melena  Objective    Vitals: BP 116/70 (BP Location: Left Arm, Patient Position: Sitting, Cuff Size: Normal)   Pulse 73   Temp 98.1 F (36.7 C) (Temporal)   Ht 5\' 6"  (1.676 m)   Wt 170 lb (77.1 kg)   SpO2 98%   BMI 27.44 kg/m  General: no acute distress , A&Ox3, looks better HEENT: PEERL, conjunctiva normal,neck is supple Cardiovascular:  RRR without murmur or gallop.  Respiratory:  Good breath sounds bilaterally, CTAB with normal respiratory effort Skin:  Warm, no rashes Ext: no edema, no calf ttp right limping     Commons side effects, risks, benefits, and alternatives for medications and treatment plan prescribed today were discussed, and the patient expressed understanding of the given instructions. Patient is instructed to call or message via MyChart if he/she has any questions or concerns regarding our treatment plan. No barriers to understanding were identified. We discussed Red Flag symptoms and signs in detail. Patient expressed understanding regarding what to do in case of urgent or emergency type symptoms.   Medication list was reconciled, printed and provided to the patient in AVS. Patient instructions and summary information was reviewed with the patient as documented in the AVS. This note was prepared with assistance of Dragon voice recognition software. Occasional wrong-word or sound-a-like substitutions may have occurred due to the inherent limitations of voice recognition software  This visit occurred during the SARS-CoV-2 public health emergency.  Safety protocols were in place, including screening questions prior to the visit, additional usage of staff PPE, and extensive cleaning of exam room while observing appropriate contact time as indicated for disinfecting solutions.

## 2019-02-10 NOTE — Patient Instructions (Signed)
Please return in 6-12 weeks for recheck. Sooner if you have any problems.   Do not restart your progesterone.   If you have any questions or concerns, please don't hesitate to send me a message via MyChart or call the office at [phone]. Thank you for visiting with [place] today! It's our pleasure caring for you.

## 2019-02-14 ENCOUNTER — Ambulatory Visit (HOSPITAL_BASED_OUTPATIENT_CLINIC_OR_DEPARTMENT_OTHER): Admit: 2019-02-14 | Payer: BLUE CROSS/BLUE SHIELD | Admitting: Orthopedic Surgery

## 2019-02-14 SURGERY — ARTHROSCOPY, KNEE, WITH LATERAL MENISCECTOMY
Anesthesia: Choice | Site: Knee | Laterality: Right

## 2019-03-01 ENCOUNTER — Encounter: Payer: Self-pay | Admitting: Family Medicine

## 2019-03-01 NOTE — Telephone Encounter (Signed)
Is this work note ok? And do you know if the covd vaccine has any effects on patient's with Bell's Palsy. Please advise.

## 2019-03-03 HISTORY — PX: KNEE ARTHROSCOPY W/ MENISCAL REPAIR: SHX1877

## 2019-05-15 ENCOUNTER — Other Ambulatory Visit (HOSPITAL_COMMUNITY): Payer: Self-pay | Admitting: Orthopedic Surgery

## 2019-05-15 DIAGNOSIS — M79604 Pain in right leg: Secondary | ICD-10-CM

## 2019-05-15 DIAGNOSIS — M7989 Other specified soft tissue disorders: Secondary | ICD-10-CM

## 2019-05-16 ENCOUNTER — Other Ambulatory Visit: Payer: Self-pay

## 2019-05-16 ENCOUNTER — Ambulatory Visit (HOSPITAL_COMMUNITY)
Admission: RE | Admit: 2019-05-16 | Discharge: 2019-05-16 | Disposition: A | Discharge status: Home / Self Care | Payer: BLUE CROSS/BLUE SHIELD | Source: Ambulatory Visit | Attending: Orthopedic Surgery | Admitting: Orthopedic Surgery

## 2019-05-16 DIAGNOSIS — M7989 Other specified soft tissue disorders: Secondary | ICD-10-CM | POA: Insufficient documentation

## 2019-05-16 DIAGNOSIS — M79604 Pain in right leg: Secondary | ICD-10-CM | POA: Insufficient documentation

## 2019-05-16 NOTE — Progress Notes (Signed)
Right lower extremity venous duplex has been completed. Preliminary results can be found in CV Proc through chart review.  Results were given to [facility] at Dr. Sherene Sires office.  05/16/19 10:30 AM Olen Cordial RVT

## 2019-06-22 ENCOUNTER — Encounter: Payer: Self-pay | Admitting: Obstetrics and Gynecology

## 2019-06-22 DIAGNOSIS — R87619 Unspecified abnormal cytological findings in specimens from cervix uteri: Secondary | ICD-10-CM | POA: Insufficient documentation

## 2019-06-22 LAB — TSH: TSH: 1.38 (ref 0.41–5.90)

## 2019-06-22 LAB — CBC AND DIFFERENTIAL: Hemoglobin: 14.1 (ref 12.0–16.0)

## 2019-06-22 LAB — HM MAMMOGRAPHY

## 2019-10-04 ENCOUNTER — Other Ambulatory Visit: Payer: Self-pay

## 2019-10-04 ENCOUNTER — Encounter: Payer: Self-pay | Admitting: Family Medicine

## 2019-10-04 ENCOUNTER — Ambulatory Visit (INDEPENDENT_AMBULATORY_CARE_PROVIDER_SITE_OTHER): Payer: BLUE CROSS/BLUE SHIELD | Admitting: Family Medicine

## 2019-10-04 VITALS — BP 118/80 | HR 73 | Temp 97.3°F | Resp 15 | Ht 66.0 in | Wt 175.4 lb

## 2019-10-04 DIAGNOSIS — M47816 Spondylosis without myelopathy or radiculopathy, lumbar region: Secondary | ICD-10-CM | POA: Diagnosis not present

## 2019-10-04 DIAGNOSIS — L409 Psoriasis, unspecified: Secondary | ICD-10-CM

## 2019-10-04 DIAGNOSIS — Z86718 Personal history of other venous thrombosis and embolism: Secondary | ICD-10-CM | POA: Diagnosis not present

## 2019-10-04 DIAGNOSIS — M160 Bilateral primary osteoarthritis of hip: Secondary | ICD-10-CM | POA: Diagnosis not present

## 2019-10-04 DIAGNOSIS — Z0000 Encounter for general adult medical examination without abnormal findings: Secondary | ICD-10-CM

## 2019-10-04 DIAGNOSIS — E559 Vitamin D deficiency, unspecified: Secondary | ICD-10-CM

## 2019-10-04 NOTE — Patient Instructions (Signed)
Please return in 12 months for your annual complete physical; please come fasting.  I will release your lab results to you on your MyChart account with further instructions. Please reply with any questions.   Let me know if you'd like to see a nutritionist.  You can consider working with a physical trainer as well.   If you have any questions or concerns, please don't hesitate to send me a message via MyChart or call the office at [phone]. Thank you for visiting with [place] today! It's our pleasure caring for you.   Preventive Care 52-53 Years Old, Female Preventive care refers to visits with your health care provider and lifestyle choices that can promote health and wellness. This includes:  A yearly physical exam. This may also be called an annual well check.  Regular dental visits and eye exams.  Immunizations.  Screening for certain conditions.  Healthy lifestyle choices, such as eating a healthy diet, getting regular exercise, not using drugs or products that contain nicotine and tobacco, and limiting alcohol use. What can I expect for my preventive care visit? Physical exam Your health care provider will check your:  Height and weight. This may be used to calculate body mass index (BMI), which tells if you are at a healthy weight.  Heart rate and blood pressure.  Skin for abnormal spots. Counseling Your health care provider may ask you questions about your:  Alcohol, tobacco, and drug use.  Emotional well-being.  Home and relationship well-being.  Sexual activity.  Eating habits.  Work and work Statistician.  Method of birth control.  Menstrual cycle.  Pregnancy history. What immunizations do I need?  Influenza (flu) vaccine  This is recommended every year. Tetanus, diphtheria, and pertussis (Tdap) vaccine  You may need a Td booster every 10 years. Varicella (chickenpox) vaccine  You may need this if you have not been vaccinated. Zoster (shingles)  vaccine  You may need this after age 52. Measles, mumps, and rubella (MMR) vaccine  You may need at least one dose of MMR if you were born in 1957 or later. You may also need a second dose. Pneumococcal conjugate (PCV13) vaccine  You may need this if you have certain conditions and were not previously vaccinated. Pneumococcal polysaccharide (PPSV23) vaccine  You may need one or two doses if you smoke cigarettes or if you have certain conditions. Meningococcal conjugate (MenACWY) vaccine  You may need this if you have certain conditions. Hepatitis A vaccine  You may need this if you have certain conditions or if you travel or work in places where you may be exposed to hepatitis A. Hepatitis B vaccine  You may need this if you have certain conditions or if you travel or work in places where you may be exposed to hepatitis B. Haemophilus influenzae type b (Hib) vaccine  You may need this if you have certain conditions. Human papillomavirus (HPV) vaccine  If recommended by your health care provider, you may need three doses over 6 months. You may receive vaccines as individual doses or as more than one vaccine together in one shot (combination vaccines). Talk with your health care provider about the risks and benefits of combination vaccines. What tests do I need? Blood tests  Lipid and cholesterol levels. These may be checked every 5 years, or more frequently if you are over 30 years old.  Hepatitis C test.  Hepatitis B test. Screening  Lung cancer screening. You may have this screening every year starting at age  52 if you have a 30-pack-year history of smoking and currently smoke or have quit within the past 15 years.  Colorectal cancer screening. All adults should have this screening starting at age 52 and continuing until age 3. Your health care provider may recommend screening at age 52 if you are at increased risk. You will have tests every 1-10 years, depending on your  results and the type of screening test.  Diabetes screening. This is done by checking your blood sugar (glucose) after you have not eaten for a while (fasting). You may have this done every 1-3 years.  Mammogram. This may be done every 1-2 years. Talk with your health care provider about when you should start having regular mammograms. This may depend on whether you have a family history of breast cancer.  BRCA-related cancer screening. This may be done if you have a family history of breast, ovarian, tubal, or peritoneal cancers.  Pelvic exam and Pap test. This may be done every 3 years starting at age 52. Starting at age 52, this may be done every 5 years if you have a Pap test in combination with an HPV test. Other tests  Sexually transmitted disease (STD) testing.  Bone density scan. This is done to screen for osteoporosis. You may have this scan if you are at high risk for osteoporosis. Follow these instructions at home: Eating and drinking  Eat a diet that includes fresh fruits and vegetables, whole grains, lean protein, and low-fat dairy.  Take vitamin and mineral supplements as recommended by your health care provider.  Do not drink alcohol if: ? Your health care provider tells you not to drink. ? You are pregnant, may be pregnant, or are planning to become pregnant.  If you drink alcohol: ? Limit how much you have to 0-1 drink a day. ? Be aware of how much alcohol is in your drink. In the [place] one drink equals one 12 oz bottle of beer (355 mL), one 5 oz glass of wine (148 mL), or one 1 oz glass of hard liquor (44 mL). Lifestyle  Take daily care of your teeth and gums.  Stay active. Exercise for at least 30 minutes on 5 or more days each week.  Do not use any products that contain nicotine or tobacco, such as cigarettes, e-cigarettes, and chewing tobacco. If you need help quitting, ask your health care provider.  If you are sexually active, practice safe sex. Use a  condom or other form of birth control (contraception) in order to prevent pregnancy and STIs (sexually transmitted infections).  If told by your health care provider, take low-dose aspirin daily starting at age 19. What's next?  Visit your health care provider once a year for a well check visit.  Ask your health care provider how often you should have your eyes and teeth checked.  Stay up to date on all vaccines. This information is not intended to replace advice given to you by your health care provider. Make sure you discuss any questions you have with your health care provider. Document Revised: 10/28/2017 Document Reviewed: 10/28/2017 Elsevier Patient Education  2020 [employer].

## 2019-10-04 NOTE — Progress Notes (Addendum)
Subjective  Chief Complaint  Patient presents with  . Annual Exam    concerned over weight gain    HPI: Jamie Smith is a 54 y.o. female who presents to [facility] today for a Female Wellness Visit. She also has the concerns and/or needs as listed above in the chief complaint. These will be addressed in addition to the Health Maintenance Visit.   Wellness Visit: annual visit with health maintenance review and exam without Pap   HM: sees Dr. Orvan Seen for Gyn care: had recent female wellness visit in April.  Reports normal mammogram at that time.  Pap smears are up-to-date and normal.  Was diagnosed with vitamin D deficiency at that time and has been on daily supplements since.  Would like this rechecked.  She also had TSH checked which was normal.  She continues to be frustrated with difficulty in achieving weight loss.  She has tried intermittent fasting, keto and daily exercise without results.  She also feels fatigued.  She and Dr. Orvan Seen contribute this to her perimenopausal status.  Last menstrual cycle was in January of this year.  She cannot use hormones due to history of recent DVT.  Wt Readings from Last 3 Encounters:  10/04/19 175 lb 6.4 oz (79.6 kg)  02/10/19 170 lb (77.1 kg)  01/31/19 173 lb 6.4 oz (78.7 kg)    Chronic disease f/u and/or acute problem visit: (deemed necessary to be done in addition to the wellness visit):  History of DVT, immobility and COVID: Resolved.  Had follow-up Doppler ultrasound earlier this year which did not show any clot.  Psoriasis: She does have a dermatologist.  Left arm had recent itching episode with hair loss.  She is used multiple creams.  Has not seen dermatologist yet.  Arthritis back and hips: Stable  Assessment  1. Annual physical exam   2. History of DVT (deep vein thrombosis)   3. Primary osteoarthritis of both hips   4. Spondylosis of lumbar region without myelopathy or radiculopathy   5. Psoriasis     6. Vitamin D deficiency      Plan  Female Wellness Visit:  Age appropriate Health Maintenance and Prevention measures were discussed with patient. Included topics are cancer screening recommendations, ways to keep healthy (see AVS) including dietary and exercise recommendations, regular eye and dental care, use of seat belts, and avoidance of moderate alcohol use and tobacco use.  Screens are up-to-date  BMI: discussed patient's BMI and encouraged positive lifestyle modifications to help get to or maintain a target BMI.  HM needs and immunizations were addressed and ordered. See below for orders. See HM and immunization section for updates.  Routine labs and screening tests ordered including cmp, cbc and lipids where appropriate.  Discussed recommendations regarding Vit D and calcium supplementation (see AVS)  Chronic disease management visit and/or acute problem visit:  Overweight: Perimenopausal status and difficulty initiating weight loss.  Offered nutritionist referral but patient declines at this time.  Discussed different strategies for weight loss.  Reassured, normal TSH shows that it is not due to her thyroid function.  Will check routine lab works.  Patient inquires about adrenal insufficiency.  She has no clinical signs of adrenal insufficiency, she is normotensive.  Will check electrolytes.  Other problems are well controlled.  Recheck vitamin D on daily supplements. Follow up: Return in about 1 year (around 10/03/2020) for complete physical.  Orders Placed This Encounter  Procedures  . CBC with  Differential/Platelet  . Comp Met (CMET)  . Lipid Profile  . TSH  . Hepatitis C antibody  . VITAMIN D 25 Hydroxy (Vit-D Deficiency, Fractures)   No orders of the defined types were placed in this encounter.     Lifestyle: Body mass index is 28.31 kg/m. Wt Readings from Last 3 Encounters:  10/04/19 175 lb 6.4 oz (79.6 kg)  02/10/19 170 lb (77.1 kg)  01/31/19 173 lb 6.4 oz  (78.7 kg)    Patient Active Problem List   Diagnosis Date Noted  . History of DVT (deep vein thrombosis) 10/04/2019  . 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 CN (constipation) 06/14/2014  . Psoriasis 06/14/2014  . Degenerative joint disease (DJD) of lumbar spine 06/14/2014    Pelvic & hip xrays reveal deg arthritis bilat hips & lumbar spine    Health Maintenance  Topic Date Due  . Hepatitis C Screening  Never done  . INFLUENZA VACCINE  10/01/2019  . COLONOSCOPY  03/02/2020  . MAMMOGRAM  05/31/2020  . PAP SMEAR-Modifier  04/02/2021  . TETANUS/TDAP  06/13/2024  . COVID-19 Vaccine  Completed  . HIV Screening  Completed   Immunization History  Administered Date(s) Administered  . Moderna SARS-COVID-2 Vaccination 04/14/2019, 05/10/2019  . Tdap 06/14/2014   We updated and reviewed the patient's past history in detail and it is documented below. Allergies: Patient is allergic to penicillins. Past Medical History Patient  has a past medical history of Bucket handle tear of lateral meniscus of left knee, subsequent encounter (01/18/2019), Depression, DVT (deep venous thrombosis) ([place]) (01/20/2019), and Psoriasis. Past Surgical History Patient  has a past surgical history that includes Knee arthroscopy w/ meniscal repair (Right, 2021). Family History: Patient family history includes Alcohol abuse in her paternal grandfather; Cancer in her maternal aunt and paternal grandmother; Cancer (age of onset: 45) in her father; Diabetes in her maternal grandmother; Healthy in her daughter, daughter, sister, and son; Heart disease in her maternal aunt, maternal grandfather, maternal uncle, and paternal uncle; Hyperlipidemia in her mother; Hypertension in her mother; Stroke in her paternal grandmother; Thyroid disease in her mother. Social History:  Patient  reports that she has never smoked. She has never  used smokeless tobacco. She reports current alcohol use of about 2.0 standard drinks of alcohol per week. She reports that she does not use drugs.  Review of Systems: Constitutional: negative for fever or malaise Ophthalmic: negative for photophobia, double vision or loss of vision Cardiovascular: negative for chest pain, dyspnea on exertion, or new LE swelling Respiratory: negative for SOB or persistent cough Gastrointestinal: negative for abdominal pain, change in bowel habits or melena Genitourinary: negative for dysuria or gross hematuria, no abnormal uterine bleeding or disharge Musculoskeletal: negative for new gait disturbance or muscular weakness Integumentary: negative for new or persistent rashes, no breast lumps Neurological: negative for TIA or stroke symptoms Psychiatric: negative for SI or delusions Allergic/Immunologic: negative for hives  Patient Care Team    Relationship Specialty Notifications Start End  Leamon Arnt, MD PCP - General Family Medicine  08/05/18   Marylynn Pearson, MD Consulting Physician Obstetrics and Gynecology  08/05/18   Elsie Saas, MD Consulting Physician Orthopedic Surgery  10/04/19     Objective  Vitals: BP 118/80   Pulse 73   Temp (!) 97.3 F (36.3 C) (Temporal)   Resp 15   Ht 5' 6"  (1.676 m)  Wt 175 lb 6.4 oz (79.6 kg)   SpO2 97%   BMI 28.31 kg/m  General:  Well developed, well nourished, no acute distress  Psych:  Alert and orientedx3, frustrated mood and flat affect HEENT:  Normocephalic, atraumatic, non-icteric sclera,  supple neck without adenopathy, mass or thyromegaly Cardiovascular:  Normal S1, S2, RRR without gallop, rub or murmur Respiratory:  Good breath sounds bilaterally, CTAB with normal respiratory effort Gastrointestinal: normal bowel sounds, soft, non-tender, no noted masses. No HSM MSK: no deformities, contusions. Joints are without erythema or swelling.  Skin:  Warm, no rashes or suspicious lesions  noted Neurologic:    Mental status is normal.  Gross motor and sensory exams are normal. Normal gait. No tremor   Commons side effects, risks, benefits, and alternatives for medications and treatment plan prescribed today were discussed, and the patient expressed understanding of the given instructions. Patient is instructed to call or message via MyChart if he/she has any questions or concerns regarding our treatment plan. No barriers to understanding were identified. We discussed Red Flag symptoms and signs in detail. Patient expressed understanding regarding what to do in case of urgent or emergency type symptoms.   Medication list was reconciled, printed and provided to the patient in AVS. Patient instructions and summary information was reviewed with the patient as documented in the AVS. This note was prepared with assistance of Dragon voice recognition software. Occasional wrong-word or sound-a-like substitutions may have occurred due to the inherent limitations of voice recognition software  This visit occurred during the SARS-CoV-2 public health emergency.  Safety protocols were in place, including screening questions prior to the visit, additional usage of staff PPE, and extensive cleaning of exam room while observing appropriate contact time as indicated for disinfecting solutions.

## 2019-10-04 NOTE — Addendum Note (Signed)
Addended by: Mathews Argyle on: 10/04/2019 02:05 PM   Modules accepted: Orders

## 2019-10-05 LAB — CBC WITH DIFFERENTIAL/PLATELET
Absolute Monocytes: 456 cells/uL (ref 200–950)
Basophils Absolute: 19 cells/uL (ref 0–200)
Basophils Relative: 0.4 %
Eosinophils Absolute: 110 cells/uL (ref 15–500)
Eosinophils Relative: 2.3 %
HCT: 39 % (ref 35.0–45.0)
Hemoglobin: 12.7 g/dL (ref 11.7–15.5)
Lymphs Abs: 1493 cells/uL (ref 850–3900)
MCH: 29.2 pg (ref 27.0–33.0)
MCHC: 32.6 g/dL (ref 32.0–36.0)
MCV: 89.7 fL (ref 80.0–100.0)
MPV: 10.1 fL (ref 7.5–12.5)
Monocytes Relative: 9.5 %
Neutro Abs: 2722 cells/uL (ref 1500–7800)
Neutrophils Relative %: 56.7 %
Platelets: 250 10*3/uL (ref 140–400)
RBC: 4.35 10*6/uL (ref 3.80–5.10)
RDW: 12.3 % (ref 11.0–15.0)
Total Lymphocyte: 31.1 %
WBC: 4.8 10*3/uL (ref 3.8–10.8)

## 2019-10-05 LAB — LIPID PANEL
Cholesterol: 169 mg/dL (ref <200)
HDL: 83 mg/dL (ref >=50)
LDL Cholesterol (Calc): 70 mg/dL (calc)
Non-HDL Cholesterol (Calc): 86 mg/dL (calc) (ref <130)
Total CHOL/HDL Ratio: 2 (calc) (ref <5.0)
Triglycerides: 76 mg/dL (ref <150)

## 2019-10-05 LAB — COMPREHENSIVE METABOLIC PANEL
AG Ratio: 1.7 (calc) (ref 1.0–2.5)
ALT: 19 U/L (ref 6–29)
AST: 21 U/L (ref 10–35)
Albumin: 4.3 g/dL (ref 3.6–5.1)
Alkaline phosphatase (APISO): 35 U/L — ABNORMAL LOW (ref 37–153)
BUN: 14 mg/dL (ref 7–25)
CO2: 29 mmol/L (ref 20–32)
Calcium: 9.5 mg/dL (ref 8.6–10.4)
Chloride: 102 mmol/L (ref 98–110)
Creat: 0.95 mg/dL (ref 0.50–1.05)
Globulin: 2.6 g/dL (calc) (ref 1.9–3.7)
Glucose, Bld: 102 mg/dL — ABNORMAL HIGH (ref 65–99)
Potassium: 4.9 mmol/L (ref 3.5–5.3)
Sodium: 140 mmol/L (ref 135–146)
Total Bilirubin: 0.4 mg/dL (ref 0.2–1.2)
Total Protein: 6.9 g/dL (ref 6.1–8.1)

## 2019-10-05 LAB — HEPATITIS C ANTIBODY
Hepatitis C Ab: NONREACTIVE
SIGNAL TO CUT-OFF: 0.13 (ref <1.00)

## 2019-10-05 LAB — TSH: TSH: 1.25 mIU/L

## 2019-10-05 LAB — VITAMIN D 25 HYDROXY (VIT D DEFICIENCY, FRACTURES): Vit D, 25-Hydroxy: 38 ng/mL (ref 30–100)

## 2019-10-11 ENCOUNTER — Encounter: Payer: Self-pay | Admitting: Family Medicine

## 2020-10-04 ENCOUNTER — Encounter: Payer: BLUE CROSS/BLUE SHIELD | Admitting: Family Medicine

## 2020-11-03 IMAGING — CT CT ANGIO CHEST
2 of 8 series · 19 of 36 positions shown · IV contrast (omnipaque)
Comparison: Chest CT dated 01/19/2019.

CLINICAL DATA: 53-year-old female with shortness of breath and
chest pain.

EXAM:
CT ANGIOGRAPHY CHEST WITH CONTRAST
TECHNIQUE: Multidetector CT imaging of the chest was performed using the
standard protocol during bolus administration of intravenous
contrast. Multiplanar CT image reconstructions and MIPs were
obtained to evaluate the vascular anatomy.
CONTRAST:  100mL OMNIPAQUE IOHEXOL 350 MG/ML SOLN

[Series 6: pe coronal mpr · coronal · 0.59mm/px · 1 of 107 slices shown]
[im 54/107  mediastinal]
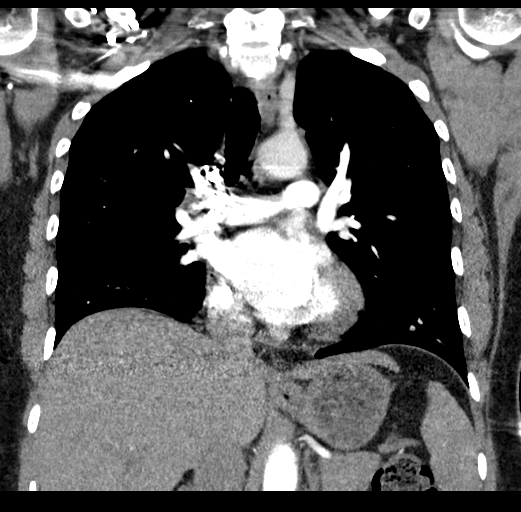

[Series 10: pe thins · axial · 0.67mm/px · z∈[-277,-25]mm · 18 of 282 slices shown]
[im 15/282  lung]
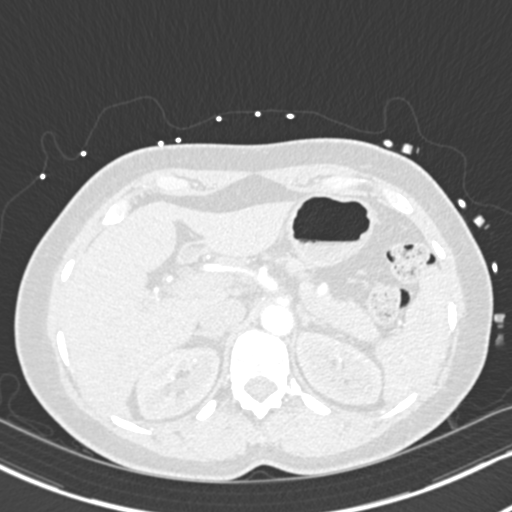
[im 30/282  mediastinal]
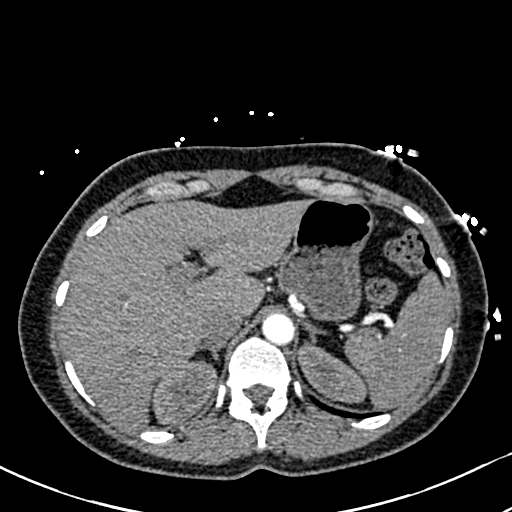
[im 45/282  lung]
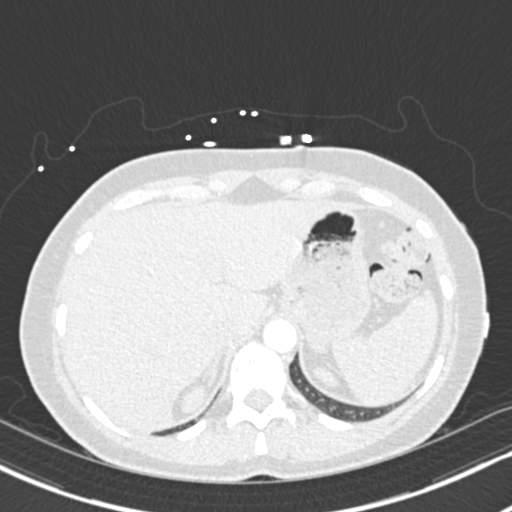
[im 60/282  mediastinal]
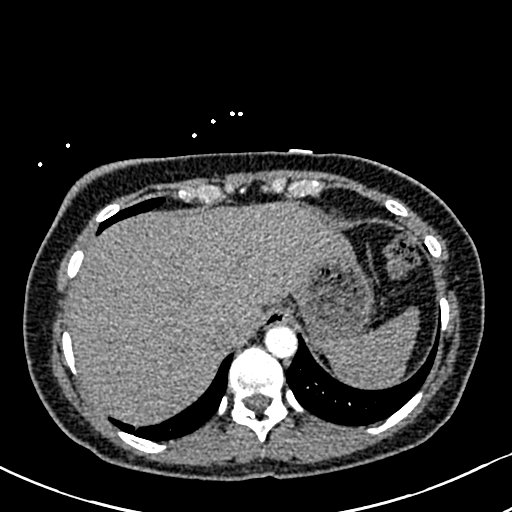
[im 74/282  lung]
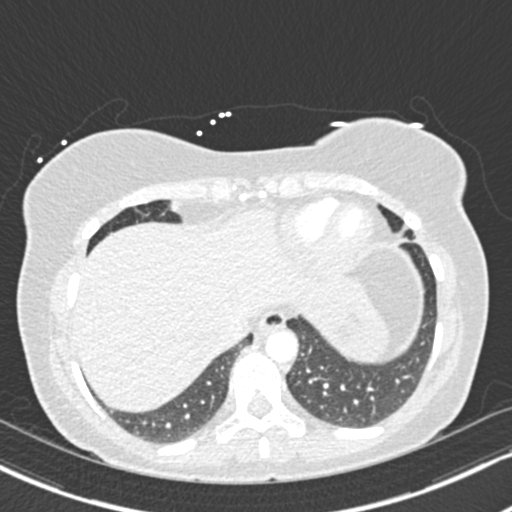
[im 89/282  mediastinal]
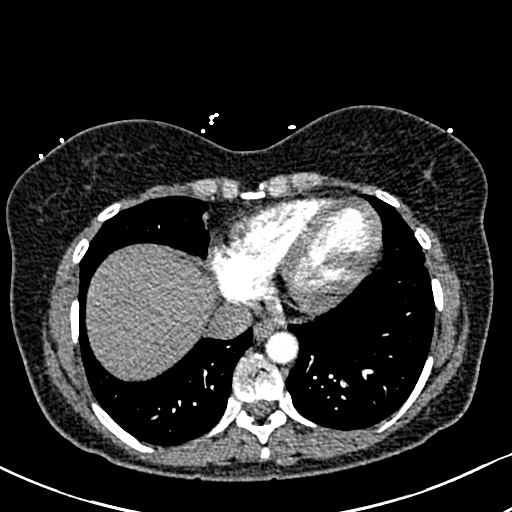
[im 104/282  lung]
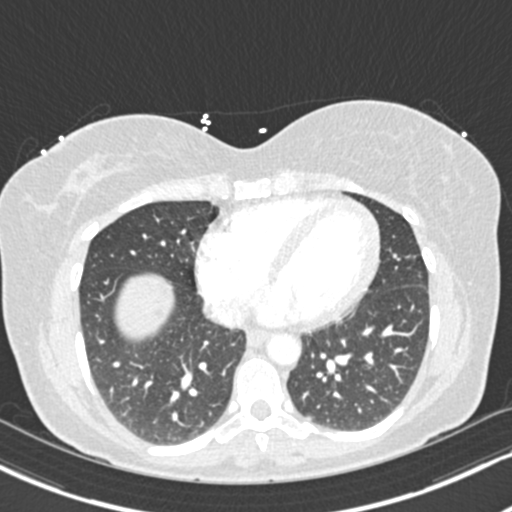
[im 119/282  mediastinal]
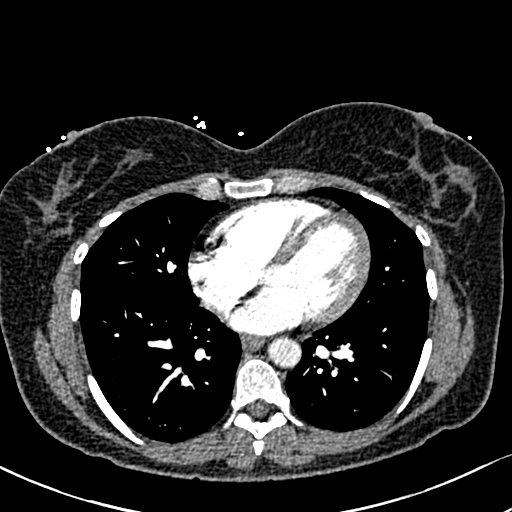
[im 134/282  lung]
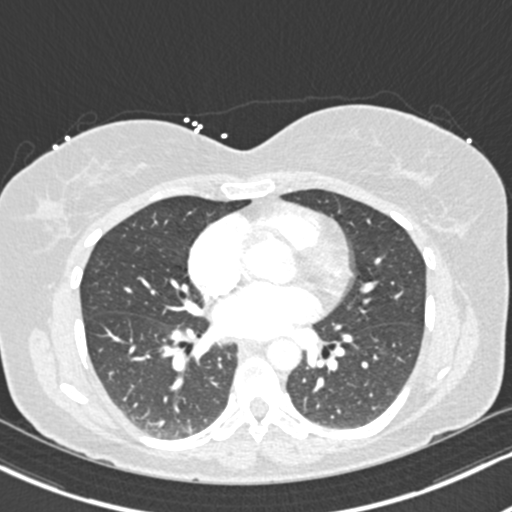
[im 148/282  mediastinal]
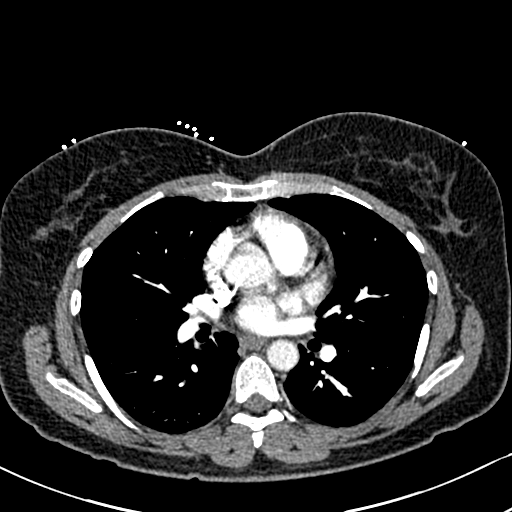
[im 163/282  lung]
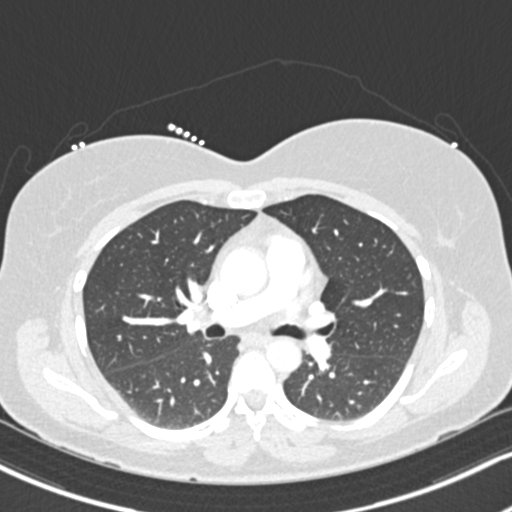
[im 178/282  mediastinal]
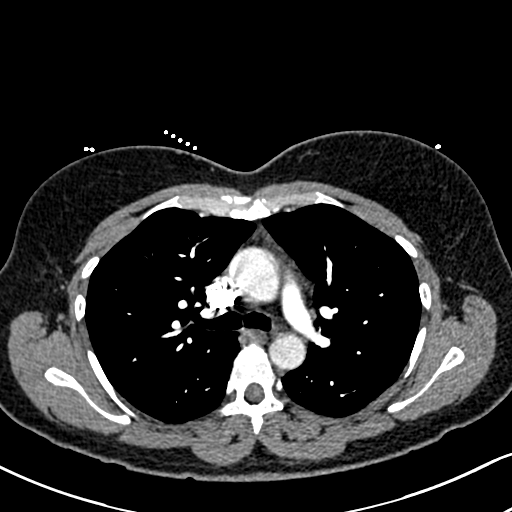
[im 193/282  lung]
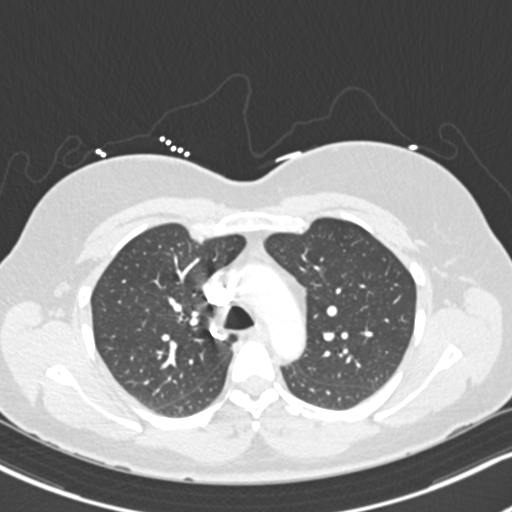
[im 208/282  mediastinal]
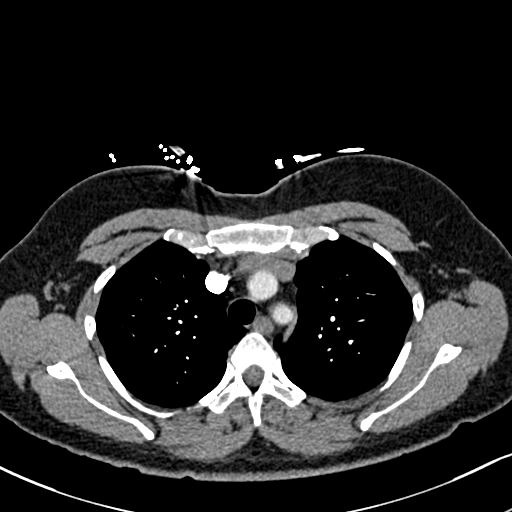
[im 222/282  lung]
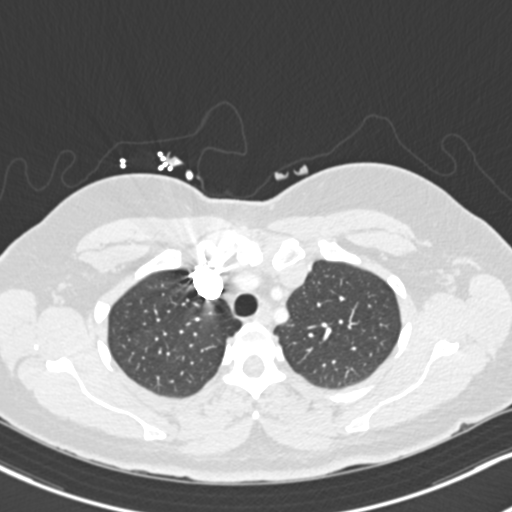
[im 237/282  mediastinal]
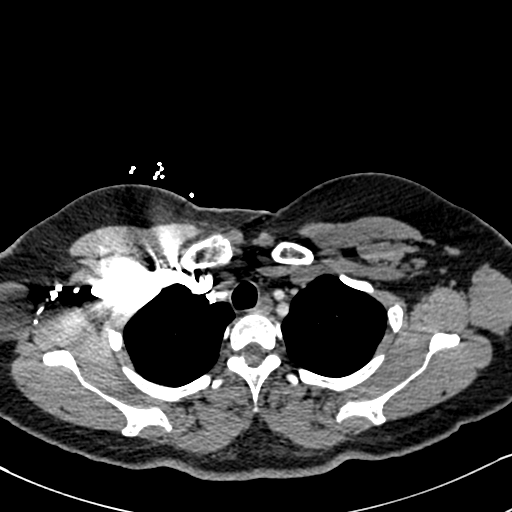
[im 252/282  lung]
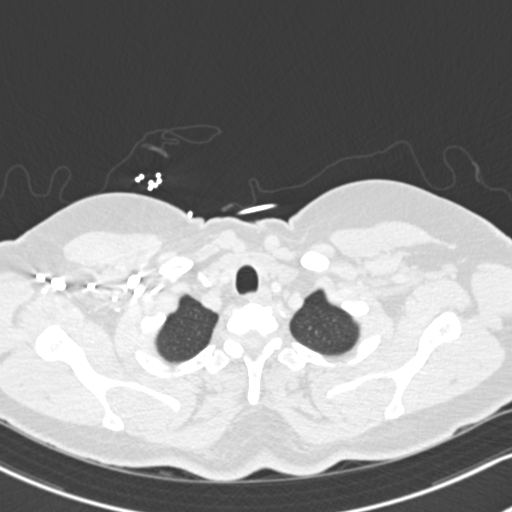
[im 267/282  mediastinal]
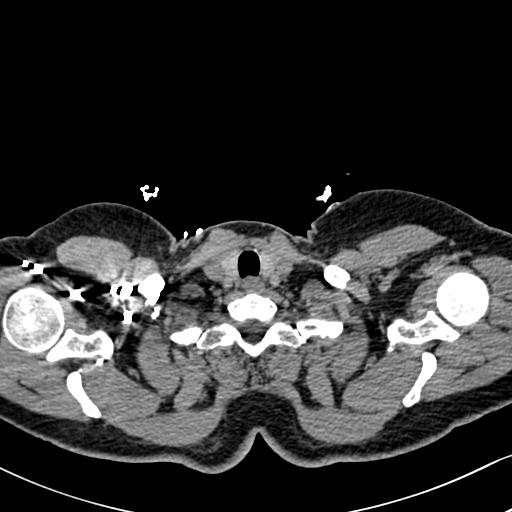

[19 of 36 positions shown; findings below may reference images not displayed]

FINDINGS: Cardiovascular: There is no cardiomegaly or pericardial effusion.
The thoracic aorta is unremarkable. There is no CT evidence of
pulmonary embolism.

Mediastinum/Nodes: No hilar or mediastinal adenopathy. Esophagus and
the thyroid gland are grossly unremarkable. No mediastinal fluid
collection.

Lungs/Pleura: There is a 3 mm left lower lobe subpleural nodule
similar to prior CT. The lungs are otherwise clear. There is no
pleural effusion or pneumothorax. The central airways are patent.

Upper Abdomen: No acute abnormality.

Musculoskeletal: No chest wall abnormality. No acute or significant
osseous findings.

Review of the MIP images confirms the above findings.
IMPRESSION: No acute intrathoracic pathology. No CT evidence of pulmonary
embolism.

## 2020-12-17 ENCOUNTER — Encounter: Payer: BLUE CROSS/BLUE SHIELD | Admitting: Family Medicine

## 2021-07-24 ENCOUNTER — Ambulatory Visit: Payer: BLUE CROSS/BLUE SHIELD | Admitting: Family

## 2021-07-24 ENCOUNTER — Encounter: Payer: Self-pay | Admitting: Family

## 2021-07-24 ENCOUNTER — Telehealth: Payer: Self-pay

## 2021-07-24 VITALS — BP 119/69 | HR 56 | Temp 98.2°F | Ht 66.0 in | Wt 159.2 lb

## 2021-07-24 DIAGNOSIS — R600 Localized edema: Secondary | ICD-10-CM

## 2021-07-24 NOTE — Telephone Encounter (Signed)
Patient was seen by [place].  Patient Name: Jamie Smith [facility] Gender: Female DOB: 09-20-1965 Age: 56 Y 10 M 3 D Return Phone Number: [phone] (Primary) Address: City/ State/ Zip: [place] [address] Client [facility] - Administrator, [facility] Provider Asencion Partridge- MD Contact Type Call Who Is Calling Patient / Member / Family / Caregiver Call Type Triage / Clinical Relationship To Patient Self Return Phone Number [phone] (Primary) Chief Complaint Leg Swelling And Edema Reason for Call Symptomatic / Request for Health Information Initial Comment Caller states that she has a right calf that is swollen. It has been swollen for 3 days. She wants to see which direction she should go in. She has develoepd a blood clot in this knee before in the past in 2020. It's not warm, it doesn't hurt but it's swollen. Her pant leg is tight on one leg and that is why she thinks it's swollen. Translation No Nurse Assessment Nurse: Clare Gandy, RN, Dahlia Client Date/Time (Eastern Time): 07/24/2021 1:13:43 PM Confirm and document reason for call. If symptomatic, describe symptoms. ---Caller reports her right calf is swollen x3 days , denies redness or pain. Does the patient have any new or worsening symptoms? ---Yes Will a triage be completed? ---Yes Related visit to physician within the last 2 weeks? ---No Does the PT have any chronic conditions? (i.e. diabetes, asthma, this includes High risk factors for pregnancy, etc.) ---Yes List chronic conditions. ---DVT Is the patient pregnant or possibly pregnant? (Ask all females between the ages of 33-55) ---No Is this a behavioral health or substance abuse call? ---No  Guidelines Guideline Title Affirmed Question Affirmed Notes Nurse Date/Time Lamount Cohen Time) Leg Swelling and Edema [1] Thigh, calf, or ankle swelling AND [2] only 1 side Debby Freiberg 07/24/2021  1:14:43 PM Disp. Time Lamount Cohen Time) Disposition Final User 07/24/2021 1:20:01 PM See HCP within 4 Hours (or PCP triage) Yes Riddle, RN, Loreli Slot Disagree/Comply Comply Caller Understands Yes PreDisposition Call Doctor Care Advice Given Per Guideline SEE HCP (OR PCP TRIAGE) WITHIN 4 HOURS: * IF OFFICE WILL BE OPEN: You need to be seen within the next 3 or 4 hours. Call your doctor (or NP/PA) now or as soon as the office opens. * OFFICE: If patient sounds stable and not seriously ill, consult PCP (or follow your office policy) to see if patient can be seen NOW in office. CARE ADVICE given per Leg Swelling and Edema (Adult) guideline. Referrals Warm transfer to backline

## 2021-07-24 NOTE — Patient Instructions (Signed)
It was very nice to see you today!  I have sent over an order for an Ultrasound of your right leg. They will call you directly to schedule.  If you have worsening symptoms: increased swelling, pain, redness, go immediately to our [facility] or one of the other [place].    PLEASE NOTE:  If you had any lab tests please let us know if you have not heard back within a few days. You may see your results on MyChart before we have a chance to review them but we will give you a call once they are reviewed by [place]. If we ordered any referrals today, please let us know if you have not heard from their office within the next week.   Please try these tips to maintain a healthy lifestyle:  Eat most of your calories during the day when you are active. Eliminate processed foods including packaged sweets (pies, cakes, cookies), reduce intake of potatoes, white bread, white pasta, and white rice. Look for whole grain options, oat flour or almond flour.  Each meal should contain half fruits/vegetables, one quarter protein, and one quarter carbs (no bigger than a computer mouse).  Cut down on sweet beverages. This includes juice, soda, and sweet tea. Also watch fruit intake, though this is a healthier sweet option, it still contains natural sugar! Limit to 3 servings daily.  Drink at least 1 glass of water with each meal and aim for at least 8 glasses per day  Exercise at least 150 minutes every week.

## 2021-07-24 NOTE — Progress Notes (Signed)
Subjective:     Patient ID: Jamie Smith, female    DOB: September 12, 1965, 56 y.o.   MRN: [id number]  Chief Complaint  Patient presents with   Leg Swelling    Pt c/o Blood clot in right calf, Present for 3 days.   HPI: Edema: Patient complains of edema. The location of the edema is lower leg(s) right.  The edema has been mild.  Onset of symptoms was 3 days ago, unchanged since that time. The edema is present all day. The patient states the patient has had 1 such episodes.  The swelling has been aggravated by nothing, relieved by nothing, and been associated with  possibly several short airplane flights on Tuesday . Cardiac risk factors include none. Pt reports noticing that her pants felt tighter on her right lower leg. Denies working out, lifting weights, or any other strenuous activity. Pt reports hx of DVT in 2020.  Assessment & Plan:   Problem List Items Addressed This Visit   None Visit Diagnoses     Leg edema, right    -  Primary right calf slightly larger than left, no fluid edema noted in leg, ankle or foot. No erythema or warmth, pt denies pain w/palpation or at rest. Reports mild tightness with dorsiflexion of foot. Pt reports flying on Tuesday on 4 different flights, all less than 2 hours, but felt the swelling afterwards. Along with hx of DVT, will go ahead and order U/S. Pt advised to seek the ER if any worsening sx prior to the U/S.    Relevant Orders   US Venous Img Lower Unilateral Right (DVT)      Outpatient Medications Prior to Visit  Medication Sig Dispense Refill   Calcium Carbonate-Vit D-Min (CALCIUM 1200 PO) Take by mouth.     Cholecalciferol (D3) 50 MCG (2000 UT) TABS Take by mouth.     Magnesium 500 MG CAPS Take by mouth.     Nutritional Supplements (VITAL HIGH PROTEIN PO) Take by mouth.     No facility-administered medications prior to visit.    Past Medical History:  Diagnosis Date   Bucket handle tear of lateral meniscus of left knee, subsequent  encounter 01/18/2019   Depression    DVT (deep venous thrombosis) ([place]) 01/20/2019   Jan 18, 2019: covid and decreased activity due to meniscal injury. xarelto started.    Psoriasis     Past Surgical History:  Procedure Laterality Date   KNEE ARTHROSCOPY W/ MENISCAL REPAIR Right 2021   Dr. Para March    Allergies  Allergen Reactions   Penicillins        Objective:    Physical Exam Vitals and nursing note reviewed.  Constitutional:      Appearance: Normal appearance.  Cardiovascular:     Rate and Rhythm: Normal rate and regular rhythm.  Pulmonary:     Effort: Pulmonary effort is normal.     Breath sounds: Normal breath sounds.  Musculoskeletal:        General: Normal range of motion.     Right lower leg: Edema (slight increase in calf size in right compared to left, no erythema, warmth, no tenderness w/palpation) present.  Skin:    General: Skin is warm and dry.  Neurological:     Mental Status: She is alert.  Psychiatric:        Mood and Affect: Mood normal.        Behavior: Behavior normal.    BP 119/69 (BP Location: Left Arm, Patient Position:  Sitting, Cuff Size: Large)   Pulse (!) 56   Temp 98.2 F (36.8 C) (Temporal)   Ht 5\' 6"  (1.676 m)   Wt 159 lb 4 oz (72.2 kg)   SpO2 99%   BMI 25.70 kg/m  Wt Readings from Last 3 Encounters:  07/24/21 159 lb 4 oz (72.2 kg)  10/04/19 175 lb 6.4 oz (79.6 kg)  02/10/19 170 lb (77.1 kg)    Jeanie Sewer, NP

## 2021-07-24 NOTE — Telephone Encounter (Signed)
Noted  

## 2021-07-24 NOTE — Telephone Encounter (Signed)
Patient has called in stating that she had a DVT in right calf back in 2020.  States right calf has been swollen for 3 days.  She wanted to know if she should be seen and where.  I have sent patient over to speak with a triage nurse.

## 2021-07-30 ENCOUNTER — Ambulatory Visit
Admission: RE | Admit: 2021-07-30 | Discharge: 2021-07-30 | Disposition: A | Discharge status: Home / Self Care | Payer: BLUE CROSS/BLUE SHIELD | Source: Ambulatory Visit | Attending: Family | Admitting: Family

## 2021-07-30 DIAGNOSIS — R600 Localized edema: Secondary | ICD-10-CM

## 2021-07-30 NOTE — Progress Notes (Signed)
Hi [place]ant[place],  Good news, no DVT found. Remember to wear compression socks whenever flying, even shorter flights may get delayed for long periods on the runway!  Take care.

## 2021-11-24 ENCOUNTER — Encounter: Payer: Self-pay | Admitting: *Deleted

## 2022-02-12 ENCOUNTER — Encounter: Payer: Self-pay | Admitting: *Deleted

## 2022-10-30 ENCOUNTER — Encounter: Payer: Self-pay | Admitting: Physician Assistant

## 2022-10-30 ENCOUNTER — Ambulatory Visit: Payer: BLUE CROSS/BLUE SHIELD | Admitting: Physician Assistant

## 2022-10-30 VITALS — BP 156/82 | HR 52 | Temp 97.7°F | Resp 18 | Ht 66.0 in | Wt 182.5 lb

## 2022-10-30 DIAGNOSIS — M542 Cervicalgia: Secondary | ICD-10-CM

## 2022-10-30 MED ORDER — MELOXICAM 7.5 MG PO TABS: ORAL_TABLET | ORAL | 0 refills | Status: AC

## 2022-10-30 MED ORDER — CYCLOBENZAPRINE HCL 5 MG PO TABS: 5.0000 mg | ORAL_TABLET | Freq: Every evening | ORAL | 1 refills | Status: AC | PRN
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
## 2022-10-30 NOTE — Patient Instructions (Signed)
It was great to see you!  Start daily mobic 7.5 mg for pain/inflammation -- this will replace any ibuprofen/aleve you are taking Take for about 1 week, then as needed after that  Start as needed 5 mg flexeril for your neck -- this may make you drowsy, do not drive after taking  If symptom(s) persist beyond two weeks or any NEW symptom(s), reach out to our office for next steps  Take care,  Jarold Motto PA-C

## 2022-11-26 ENCOUNTER — Other Ambulatory Visit: Payer: Self-pay | Admitting: Physician Assistant
